# Patient Record
Sex: Male | Born: 1955 | Race: Black or African American | Hispanic: No | Marital: Married | Smoking: Current every day smoker
Health system: Southern US, Community
[De-identification: ages and names within clinical notes are randomized; demographics above are authoritative.]

## PROBLEM LIST (undated history)

## (undated) DIAGNOSIS — C61 Malignant neoplasm of prostate: Secondary | ICD-10-CM

## (undated) DIAGNOSIS — F1010 Alcohol abuse, uncomplicated: Secondary | ICD-10-CM

## (undated) DIAGNOSIS — I639 Cerebral infarction, unspecified: Secondary | ICD-10-CM

## (undated) DIAGNOSIS — R972 Elevated prostate specific antigen [PSA]: Secondary | ICD-10-CM

## (undated) DIAGNOSIS — E785 Hyperlipidemia, unspecified: Secondary | ICD-10-CM

## (undated) DIAGNOSIS — I10 Essential (primary) hypertension: Secondary | ICD-10-CM

## (undated) DIAGNOSIS — D649 Anemia, unspecified: Secondary | ICD-10-CM

## (undated) HISTORY — PX: PROSTATE BIOPSY: SHX241

---

## 2000-03-20 DIAGNOSIS — Z5900 Homelessness unspecified: Secondary | ICD-10-CM | POA: Insufficient documentation

## 2001-02-04 ENCOUNTER — Emergency Department (HOSPITAL_COMMUNITY): Admission: EM | Admit: 2001-02-04 | Discharge: 2001-02-04 | Payer: Self-pay | Admitting: Emergency Medicine

## 2003-11-11 ENCOUNTER — Emergency Department (HOSPITAL_COMMUNITY): Admission: AD | Admit: 2003-11-11 | Discharge: 2003-11-11 | Payer: Self-pay | Admitting: Emergency Medicine

## 2005-05-24 ENCOUNTER — Emergency Department (HOSPITAL_COMMUNITY): Admission: EM | Admit: 2005-05-24 | Discharge: 2005-05-24 | Payer: Self-pay | Admitting: Emergency Medicine

## 2016-12-27 ENCOUNTER — Inpatient Hospital Stay (HOSPITAL_COMMUNITY)
Admission: EM | Admit: 2016-12-27 | Discharge: 2017-01-02 | DRG: 065 | Disposition: A | Discharge status: Home / Self Care | Payer: Self-pay | Attending: Internal Medicine | Admitting: Internal Medicine

## 2016-12-27 ENCOUNTER — Emergency Department (HOSPITAL_COMMUNITY): Payer: Self-pay

## 2016-12-27 ENCOUNTER — Encounter (HOSPITAL_COMMUNITY): Payer: Self-pay | Admitting: *Deleted

## 2016-12-27 DIAGNOSIS — I639 Cerebral infarction, unspecified: Secondary | ICD-10-CM

## 2016-12-27 DIAGNOSIS — I10 Essential (primary) hypertension: Secondary | ICD-10-CM | POA: Diagnosis present

## 2016-12-27 DIAGNOSIS — I63512 Cerebral infarction due to unspecified occlusion or stenosis of left middle cerebral artery: Secondary | ICD-10-CM

## 2016-12-27 DIAGNOSIS — R441 Visual hallucinations: Secondary | ICD-10-CM | POA: Diagnosis present

## 2016-12-27 DIAGNOSIS — I6339 Cerebral infarction due to thrombosis of other cerebral artery: Secondary | ICD-10-CM

## 2016-12-27 DIAGNOSIS — F10930 Alcohol use, unspecified with withdrawal, uncomplicated: Secondary | ICD-10-CM

## 2016-12-27 DIAGNOSIS — K7010 Alcoholic hepatitis without ascites: Secondary | ICD-10-CM | POA: Diagnosis present

## 2016-12-27 DIAGNOSIS — L0390 Cellulitis, unspecified: Secondary | ICD-10-CM | POA: Diagnosis present

## 2016-12-27 DIAGNOSIS — E871 Hypo-osmolality and hyponatremia: Secondary | ICD-10-CM | POA: Diagnosis present

## 2016-12-27 DIAGNOSIS — F10230 Alcohol dependence with withdrawal, uncomplicated: Secondary | ICD-10-CM

## 2016-12-27 DIAGNOSIS — Z23 Encounter for immunization: Secondary | ICD-10-CM

## 2016-12-27 DIAGNOSIS — E876 Hypokalemia: Secondary | ICD-10-CM | POA: Diagnosis present

## 2016-12-27 DIAGNOSIS — F17200 Nicotine dependence, unspecified, uncomplicated: Secondary | ICD-10-CM

## 2016-12-27 DIAGNOSIS — R410 Disorientation, unspecified: Secondary | ICD-10-CM

## 2016-12-27 DIAGNOSIS — E785 Hyperlipidemia, unspecified: Secondary | ICD-10-CM | POA: Diagnosis present

## 2016-12-27 DIAGNOSIS — I493 Ventricular premature depolarization: Secondary | ICD-10-CM | POA: Diagnosis present

## 2016-12-27 DIAGNOSIS — F1010 Alcohol abuse, uncomplicated: Secondary | ICD-10-CM

## 2016-12-27 DIAGNOSIS — Z681 Body mass index (BMI) 19 or less, adult: Secondary | ICD-10-CM

## 2016-12-27 DIAGNOSIS — F10931 Alcohol use, unspecified with withdrawal delirium: Secondary | ICD-10-CM | POA: Diagnosis present

## 2016-12-27 DIAGNOSIS — R740 Nonspecific elevation of levels of transaminase and lactic acid dehydrogenase [LDH]: Secondary | ICD-10-CM | POA: Diagnosis present

## 2016-12-27 DIAGNOSIS — I63441 Cerebral infarction due to embolism of right cerebellar artery: Principal | ICD-10-CM | POA: Diagnosis present

## 2016-12-27 DIAGNOSIS — F17210 Nicotine dependence, cigarettes, uncomplicated: Secondary | ICD-10-CM | POA: Diagnosis present

## 2016-12-27 DIAGNOSIS — T184XXA Foreign body in colon, initial encounter: Secondary | ICD-10-CM

## 2016-12-27 DIAGNOSIS — R509 Fever, unspecified: Secondary | ICD-10-CM

## 2016-12-27 DIAGNOSIS — R471 Dysarthria and anarthria: Secondary | ICD-10-CM | POA: Diagnosis present

## 2016-12-27 DIAGNOSIS — D696 Thrombocytopenia, unspecified: Secondary | ICD-10-CM | POA: Diagnosis present

## 2016-12-27 DIAGNOSIS — T183XXA Foreign body in small intestine, initial encounter: Secondary | ICD-10-CM

## 2016-12-27 DIAGNOSIS — E46 Unspecified protein-calorie malnutrition: Secondary | ICD-10-CM | POA: Diagnosis present

## 2016-12-27 DIAGNOSIS — L03114 Cellulitis of left upper limb: Secondary | ICD-10-CM | POA: Diagnosis present

## 2016-12-27 DIAGNOSIS — F10231 Alcohol dependence with withdrawal delirium: Secondary | ICD-10-CM | POA: Diagnosis present

## 2016-12-27 DIAGNOSIS — E860 Dehydration: Secondary | ICD-10-CM | POA: Diagnosis present

## 2016-12-27 DIAGNOSIS — R569 Unspecified convulsions: Secondary | ICD-10-CM | POA: Diagnosis present

## 2016-12-27 HISTORY — DX: Alcohol abuse, uncomplicated: F10.10

## 2016-12-27 LAB — COMPREHENSIVE METABOLIC PANEL
ALBUMIN: 3.6 g/dL (ref 3.5–5.0)
ALT: 45 U/L (ref 17–63)
AST: 198 U/L — AB (ref 15–41)
Alkaline Phosphatase: 197 U/L — ABNORMAL HIGH (ref 38–126)
Anion gap: 17 — ABNORMAL HIGH (ref 5–15)
BILIRUBIN TOTAL: 1.8 mg/dL — AB (ref 0.3–1.2)
BUN: 13 mg/dL (ref 6–20)
CO2: 18 mmol/L — AB (ref 22–32)
Calcium: 9 mg/dL (ref 8.9–10.3)
Chloride: 97 mmol/L — ABNORMAL LOW (ref 101–111)
Creatinine, Ser: 1.83 mg/dL — ABNORMAL HIGH (ref 0.61–1.24)
GFR calc Af Amer: 45 mL/min — ABNORMAL LOW (ref >60)
GFR calc non Af Amer: 39 mL/min — ABNORMAL LOW (ref >60)
GLUCOSE: 82 mg/dL (ref 65–99)
POTASSIUM: 2.9 mmol/L — AB (ref 3.5–5.1)
Sodium: 132 mmol/L — ABNORMAL LOW (ref 135–145)
TOTAL PROTEIN: 8.2 g/dL — AB (ref 6.5–8.1)

## 2016-12-27 LAB — PROTIME-INR
INR: 1.1
Prothrombin Time: 14.2 seconds (ref 11.4–15.2)

## 2016-12-27 LAB — URINALYSIS, ROUTINE W REFLEX MICROSCOPIC
GLUCOSE, UA: NEGATIVE mg/dL
Ketones, ur: NEGATIVE mg/dL
LEUKOCYTES UA: NEGATIVE
NITRITE: NEGATIVE
PROTEIN: 100 mg/dL — AB
Specific Gravity, Urine: 1.027 (ref 1.005–1.030)
pH: 5 (ref 5.0–8.0)

## 2016-12-27 LAB — I-STAT CG4 LACTIC ACID, ED
Lactic Acid, Venous: 3.68 mmol/L (ref 0.5–1.9)
Lactic Acid, Venous: 1.05 mmol/L (ref 0.5–1.9)

## 2016-12-27 LAB — CBC WITH DIFFERENTIAL/PLATELET
Basophils Absolute: 0 10*3/uL (ref 0.0–0.1)
Basophils Relative: 0 %
EOS ABS: 0.1 10*3/uL (ref 0.0–0.7)
Eosinophils Relative: 1 %
HEMATOCRIT: 40.5 % (ref 39.0–52.0)
HEMOGLOBIN: 13.5 g/dL (ref 13.0–17.0)
LYMPHS ABS: 1.1 10*3/uL (ref 0.7–4.0)
LYMPHS PCT: 13 %
MCH: 33.8 pg (ref 26.0–34.0)
MCHC: 33.3 g/dL (ref 30.0–36.0)
MCV: 101.5 fL — AB (ref 78.0–100.0)
MONOS PCT: 10 %
Monocytes Absolute: 0.9 10*3/uL (ref 0.1–1.0)
NEUTROS ABS: 6.7 10*3/uL (ref 1.7–7.7)
NEUTROS PCT: 76 %
Platelets: 84 10*3/uL — ABNORMAL LOW (ref 150–400)
RBC: 3.99 MIL/uL — ABNORMAL LOW (ref 4.22–5.81)
RDW: 15.7 % — ABNORMAL HIGH (ref 11.5–15.5)
WBC: 8.8 10*3/uL (ref 4.0–10.5)

## 2016-12-27 LAB — ETHANOL

## 2016-12-27 MED ORDER — LORAZEPAM 2 MG/ML IJ SOLN: 0.0000 mg | Freq: Two times a day (BID) | INTRAMUSCULAR | Status: DC

## 2016-12-27 MED ORDER — SODIUM CHLORIDE 0.9 % IV BOLUS (SEPSIS)
1000.0000 mL | Freq: Once | INTRAVENOUS | Status: AC
  Administered 2016-12-27: 1000 mL via INTRAVENOUS

## 2016-12-27 MED ORDER — FOLIC ACID 1 MG PO TABS
1.0000 mg | ORAL_TABLET | Freq: Once | ORAL | Status: AC
  Administered 2016-12-27: 1 mg via ORAL
  Filled 2016-12-27: qty 1

## 2016-12-27 MED ORDER — ERYTHROMYCIN 5 MG/GM OP OINT
1.0000 "application " | TOPICAL_OINTMENT | Freq: Once | OPHTHALMIC | Status: AC
  Administered 2016-12-27: 1 via OPHTHALMIC
  Filled 2016-12-27: qty 3.5

## 2016-12-27 MED ORDER — VITAMIN B-1 100 MG PO TABS
100.0000 mg | ORAL_TABLET | Freq: Once | ORAL | Status: AC
  Administered 2016-12-27: 100 mg via ORAL
  Filled 2016-12-27: qty 1

## 2016-12-27 MED ORDER — LORAZEPAM 2 MG/ML IJ SOLN
2.0000 mg | Freq: Once | INTRAMUSCULAR | Status: AC
  Administered 2016-12-27: 2 mg via INTRAVENOUS
  Filled 2016-12-27: qty 1

## 2016-12-27 MED ORDER — LORAZEPAM 2 MG/ML IJ SOLN
1.0000 mg | Freq: Once | INTRAMUSCULAR | Status: AC
  Administered 2016-12-27: 1 mg via INTRAVENOUS
  Filled 2016-12-27: qty 1

## 2016-12-27 MED ORDER — LORAZEPAM 2 MG/ML IJ SOLN: 0.0000 mg | Freq: Four times a day (QID) | INTRAMUSCULAR | Status: DC

## 2016-12-27 NOTE — ED Triage Notes (Signed)
Pt here with his step-sister for weakness.  His sister [place] could not contact him by phone, so a family member here when to check on him.  She states he has lost a lot of weight and she was barely able to get him out of the house, he was so weak.  Pt repeatedly unable to tell me what year it is and family states multiple episodes of syncope.  Pt states he's "had the flu" for  Several weeks.  States only drinks 24 oz etoh and then more on the weekend.

## 2016-12-27 NOTE — Consult Note (Signed)
Admission H&P    Chief Complaint: Altered mental status with confusion and agitation.  HPI: Ian Thornton is an 61 y.o. male with a history of alcohol abuse brought to the ED by family members because of a change in mental status with agitation and confusion. They also noticed visual hallucinations. It's unclear when patient last drank alcohol. He reportedly had a seizure 2-3 days ago. No medical attention was sought. Alcohol level in ED was undetectable. CT scan of his head showed a low density right basal ganglial lesion indicative of acute possible subacute ischemic stroke. It's unclear when he was last known normal. No focal weakness, facial droop is been noted by family.  LSN: Unclear tPA Given: No: Unknown when last seen well mRankin:  History reviewed. No pertinent past medical history.  History reviewed. No pertinent surgical history.  No family history on file. Social History:  reports that he has been smoking.  He has been smoking about 0.50 packs per day. He has never used smokeless tobacco. He reports that he drinks about 9.6 oz of alcohol per week . He reports that he does not use drugs.  Allergies: No Known Allergies  Medications: Unknown if patient takes any medications currently.  ROS: Unavailable due to patient's status changes.  Physical Examination: Blood pressure 172/76, pulse 102, temperature 97.7 F (36.5 C), temperature source Oral, resp. rate 20, weight 63.3 kg (139 lb 9 oz), SpO2 (!) 85 %.  HEENT-  Normocephalic, no lesions, without obvious abnormality.  Normal external eye and conjunctiva.  Normal TM's bilaterally.  Normal auditory canals and external ears. Normal external nose, mucus membranes and septum.  Normal pharynx. Neck supple with no masses, nodes, nodules or enlargement. Cardiovascular - mild tachycardia, normal rhythm, normal S1 and S2, no murmur or gallop Lungs - chest clear, no wheezing, rales, normal symmetric air entry Abdomen - soft,  non-tender; bowel sounds normal; no masses,  no organomegaly Extremities - no joint deformities, effusion, or inflammation  Neurologic Examination: Mental Status: Alert, oriented, moderately agitated and freely confabulating.  Speech fluent without evidence of aphasia. Able to follow commands without difficulty. Cranial Nerves: II-Visual fields were normal. III/IV/VI-Pupils were equal and reacted normally to light. Extraocular movements were full and conjugate.    V/VII-no facial numbness and no facial weakness. VIII-normal. X-mild dysarthria. XI: trapezius strength/neck flexion strength normal bilaterally XII-midline tongue extension with normal strength. Motor: 5/5 bilaterally with normal tone and bulk Sensory: Normal throughout. Deep Tendon Reflexes: 1+ and symmetric. Plantars: Flexor bilaterally Cerebellar: Normal finger-to-nose testing septal mild intention tremor bilaterally.   Results for orders placed or performed during the hospital encounter of 12/27/16 (from the past 48 hour(s))  Comprehensive metabolic panel     Status: Abnormal   Collection Time: 12/27/16  7:00 PM  Result Value Ref Range   Sodium 132 (L) 135 - 145 mmol/L   Potassium 2.9 (L) 3.5 - 5.1 mmol/L   Chloride 97 (L) 101 - 111 mmol/L   CO2 18 (L) 22 - 32 mmol/L   Glucose, Bld 82 65 - 99 mg/dL   BUN 13 6 - 20 mg/dL   Creatinine, Ser 1.83 (H) 0.61 - 1.24 mg/dL   Calcium 9.0 8.9 - 10.3 mg/dL   Total Protein 8.2 (H) 6.5 - 8.1 g/dL   Albumin 3.6 3.5 - 5.0 g/dL   AST 198 (H) 15 - 41 U/L   ALT 45 17 - 63 U/L   Alkaline Phosphatase 197 (H) 38 - 126 U/L   Total Bilirubin  1.8 (H) 0.3 - 1.2 mg/dL   GFR calc non Af Amer 39 (L) >60 mL/min   GFR calc Af Amer 45 (L) >60 mL/min    Comment: (NOTE) The eGFR has been calculated using the CKD EPI equation. This calculation has not been validated in all clinical situations. eGFR's persistently <60 mL/min signify possible Chronic Kidney Disease.    Anion gap 17 (H) 5 -  15  CBC with Differential     Status: Abnormal   Collection Time: 12/27/16  7:00 PM  Result Value Ref Range   WBC 8.8 4.0 - 10.5 K/uL   RBC 3.99 (L) 4.22 - 5.81 MIL/uL   Hemoglobin 13.5 13.0 - 17.0 g/dL   HCT 40.5 39.0 - 52.0 %   MCV 101.5 (H) 78.0 - 100.0 fL   MCH 33.8 26.0 - 34.0 pg   MCHC 33.3 30.0 - 36.0 g/dL   RDW 15.7 (H) 11.5 - 15.5 %   Platelets 84 (L) 150 - 400 K/uL    Comment: SPECIMEN CHECKED FOR CLOTS REPEATED TO VERIFY PLATELET COUNT CONFIRMED BY SMEAR LARGE PLATELETS PRESENT    Neutrophils Relative % 76 %   Neutro Abs 6.7 1.7 - 7.7 K/uL   Lymphocytes Relative 13 %   Lymphs Abs 1.1 0.7 - 4.0 K/uL   Monocytes Relative 10 %   Monocytes Absolute 0.9 0.1 - 1.0 K/uL   Eosinophils Relative 1 %   Eosinophils Absolute 0.1 0.0 - 0.7 K/uL   Basophils Relative 0 %   Basophils Absolute 0.0 0.0 - 0.1 K/uL  Protime-INR     Status: None   Collection Time: 12/27/16  7:00 PM  Result Value Ref Range   Prothrombin Time 14.2 11.4 - 15.2 seconds   INR 1.10   I-Stat CG4 Lactic Acid, ED     Status: Abnormal   Collection Time: 12/27/16  8:09 PM  Result Value Ref Range   Lactic Acid, Venous 3.68 (HH) 0.5 - 1.9 mmol/L   Comment NOTIFIED PHYSICIAN   Ethanol     Status: None   Collection Time: 12/27/16  8:36 PM  Result Value Ref Range   Alcohol, Ethyl (B) <5 <5 mg/dL    Comment:        LOWEST DETECTABLE LIMIT FOR SERUM ALCOHOL IS 5 mg/dL FOR MEDICAL PURPOSES ONLY   Urinalysis, Routine w reflex microscopic     Status: Abnormal   Collection Time: 12/27/16  8:40 PM  Result Value Ref Range   Color, Urine AMBER (A) YELLOW    Comment: BIOCHEMICALS MAY BE AFFECTED BY COLOR   APPearance CLOUDY (A) CLEAR   Specific Gravity, Urine 1.027 1.005 - 1.030   pH 5.0 5.0 - 8.0   Glucose, UA NEGATIVE NEGATIVE mg/dL   Hgb urine dipstick SMALL (A) NEGATIVE   Bilirubin Urine MODERATE (A) NEGATIVE   Ketones, ur NEGATIVE NEGATIVE mg/dL   Protein, ur 100 (A) NEGATIVE mg/dL   Nitrite NEGATIVE  NEGATIVE   Leukocytes, UA NEGATIVE NEGATIVE   RBC / HPF 6-30 0 - 5 RBC/hpf   WBC, UA 6-30 0 - 5 WBC/hpf   Bacteria, UA RARE (A) NONE SEEN   Squamous Epithelial / LPF 0-5 (A) NONE SEEN   Mucous PRESENT    Hyaline Casts, UA PRESENT   I-Stat CG4 Lactic Acid, ED     Status: None   Collection Time: 12/27/16  9:26 PM  Result Value Ref Range   Lactic Acid, Venous 1.05 0.5 - 1.9 mmol/L   Dg Chest 2  View  Result Date: 12/27/2016 CLINICAL DATA:  Weakness and weight loss. EXAM: CHEST  2 VIEW COMPARISON:  Report from 08/09/2000 FINDINGS: The heart size and mediastinal contours are within normal limits. The lungs are mildly hyperinflated without pneumonic consolidation, CHF nor effusion. No pneumothorax. Bilateral nipple shadows are seen over the lower lobes. The visualized skeletal structures are unremarkable. IMPRESSION: No active cardiopulmonary disease. Mild hyperinflation of the lungs. Electronically Signed   By: Ashley Royalty M.D.   On: 12/27/2016 19:27   Ct Head Wo Contrast  Result Date: 12/27/2016 CLINICAL DATA:  Initial evaluation for acute weakness. EXAM: CT HEAD WITHOUT CONTRAST TECHNIQUE: Contiguous axial images were obtained from the base of the skull through the vertex without intravenous contrast. COMPARISON:  Prior CT from 05/25/2005. FINDINGS: Brain: Diffuse prominence of the CSF containing spaces is compatible with generalized cerebral atrophy. Patchy hypodensity within the periventricular and deep white matter of both cerebral hemispheres is most consistent with chronic microvascular ischemic disease. Small remote lacunar infarct within the left basal ganglia. There is evolving hypodensity involving the right basal ganglia, with involvement of the right caudate and anterior lenticular nucleus, compatible with acute ischemic infarct. No associated mass effect or hemorrhage. No other acute intracranial hemorrhage or infarct identified. No mass lesion, midline shift, or mass effect. No  hydrocephalus. No extra-axial fluid collection. Vascular: No hyperdense vessel. Scattered vascular calcifications noted within the carotid siphons. Skull: Focal hyperdensity noted at the right frontal scalp, of doubtful clinical significance. Scalp soft tissues demonstrate no acute abnormality. Calvarium intact. Sinuses/Orbits: Globes and orbital soft tissues within normal limits. Paranasal sinuses and mastoid air cells are clear. IMPRESSION: 1. Evolving acute/early subacute ischemic right basal ganglia infarct. 2. Small remote lacunar infarct within the left basal ganglia. 3. Age-related cerebral atrophy with chronic microvascular ischemic disease. Electronically Signed   By: Jeannine Boga M.D.   On: 12/27/2016 21:51    Assessment: 61 y.o. male with unclear past medical history presenting with acute delirium, most likely associated with alcohol withdrawal, hypertension, and probable acute to subacute right basal ganglia ischemic infarction.  Stroke Risk Factors - hypertension  Plan: 1. HgbA1c, fasting lipid panel 2. MRI, MRA  of the brain without contrast 3. PT consult, OT consult, Speech consult 4. Echocardiogram 5. Carotid dopplers 6. Prophylactic therapy-Antiplatelet med: Aspirin  7. Risk factor modification 8. Telemetry monitoring 9. EEG, routine adult study 10. No anticonvulsant medication indicated unless patient has a recurrent witnessed seizure, or EEG indicates possible underlying seizure disorder 11. CIWA protocol for management of alcohol withdrawal and acute delirium  C.R. Nicole Kindred, MD [facility] [phone]  12/27/2016, 11:04 PM

## 2016-12-27 NOTE — ED Provider Notes (Signed)
[facility]-EMERGENCY DEPT Provider Note   CSN: [id number] Arrival date & time: 12/27/16  1802     History   Chief Complaint Chief Complaint  Patient presents with  . Weakness    HPI Brysen Shankman is a 61 y.o. male.  HPI  Patient presents with his stepsister who assists with the history of present illness. Patient is hallucinating, speaking tangentially, nonsensically, level V caveat secondary to change in mental status per She notes that over the past day she has noticed increasingly atypical behavior from the patient, with ongoing cough, anorexia.  Low 5 caveat    History reviewed. No pertinent past medical history.  There are no active problems to display for this patient.   History reviewed. No pertinent surgical history.     Home Medications    Prior to Admission medications   Not on File    Family History No family history on file.  Social History Social History  Substance Use Topics  . Smoking status: Current Every Day Smoker    Packs/day: 0.50  . Smokeless tobacco: Never Used  . Alcohol use 9.6 oz/week    16 Shots of liquor per week     Comment: 24 oz beer per day     Allergies   Patient has no known allergies.   Review of Systems Review of Systems  Unable to perform ROS: Mental status change     Physical Exam Updated Vital Signs BP (!) 122/103   Pulse 98   Temp 97.7 F (36.5 C) (Oral)   Resp 20   Wt 139 lb 9 oz (63.3 kg)   SpO2 100%   Physical Exam  Constitutional: He has a sickly appearance. No distress.  HENT:  Head: Normocephalic and atraumatic.  Eyes: Conjunctivae and EOM are normal.    Cardiovascular: Normal rate and regular rhythm.   Pulmonary/Chest: Effort normal. No stridor. No respiratory distress.  Abdominal: He exhibits no distension.  Musculoskeletal: He exhibits no edema.  Neurological: He is alert. He displays no atrophy and no tremor. No sensory deficit. He exhibits normal muscle tone.  Skin: Skin is warm and  dry.  Psychiatric: His mood appears anxious. His speech is tangential. Thought content is delusional. Cognition and memory are impaired.  Nursing note and vitals reviewed.    ED Treatments / Results  Labs (all labs ordered are listed, but only abnormal results are displayed) Labs Reviewed  COMPREHENSIVE METABOLIC PANEL - Abnormal; Notable for the following:       Result Value   Sodium 132 (*)    Potassium 2.9 (*)    Chloride 97 (*)    CO2 18 (*)    Creatinine, Ser 1.83 (*)    Total Protein 8.2 (*)    AST 198 (*)    Alkaline Phosphatase 197 (*)    Total Bilirubin 1.8 (*)    GFR calc non Af Amer 39 (*)    GFR calc Af Amer 45 (*)    Anion gap 17 (*)    All other components within normal limits  CBC WITH DIFFERENTIAL/PLATELET - Abnormal; Notable for the following:    RBC 3.99 (*)    MCV 101.5 (*)    RDW 15.7 (*)    Platelets 84 (*)    All other components within normal limits  URINALYSIS, ROUTINE W REFLEX MICROSCOPIC - Abnormal; Notable for the following:    Color, Urine AMBER (*)    APPearance CLOUDY (*)    Hgb urine dipstick SMALL (*)  Bilirubin Urine MODERATE (*)    Protein, ur 100 (*)    Bacteria, UA RARE (*)    Squamous Epithelial / LPF 0-5 (*)    All other components within normal limits  I-STAT CG4 LACTIC ACID, ED - Abnormal; Notable for the following:    Lactic Acid, Venous 3.68 (*)    All other components within normal limits  CULTURE, BLOOD (ROUTINE X 2)  CULTURE, BLOOD (ROUTINE X 2)  URINE CULTURE  PROTIME-INR  ETHANOL  I-STAT CG4 LACTIC ACID, ED    EKG  EKG Interpretation None       Radiology Dg Chest 2 View  Result Date: 12/27/2016 CLINICAL DATA:  Weakness and weight loss. EXAM: CHEST  2 VIEW COMPARISON:  Report from 08/09/2000 FINDINGS: The heart size and mediastinal contours are within normal limits. The lungs are mildly hyperinflated without pneumonic consolidation, CHF nor effusion. No pneumothorax. Bilateral nipple shadows are seen over the  lower lobes. The visualized skeletal structures are unremarkable. IMPRESSION: No active cardiopulmonary disease. Mild hyperinflation of the lungs. Electronically Signed   By: Tollie Eth M.D.   On: 12/27/2016 19:27   Ct Head Wo Contrast  Result Date: 12/27/2016 CLINICAL DATA:  Initial evaluation for acute weakness. EXAM: CT HEAD WITHOUT CONTRAST TECHNIQUE: Contiguous axial images were obtained from the base of the skull through the vertex without intravenous contrast. COMPARISON:  Prior CT from 05/25/2005. FINDINGS: Brain: Diffuse prominence of the CSF containing spaces is compatible with generalized cerebral atrophy. Patchy hypodensity within the periventricular and deep white matter of both cerebral hemispheres is most consistent with chronic microvascular ischemic disease. Small remote lacunar infarct within the left basal ganglia. There is evolving hypodensity involving the right basal ganglia, with involvement of the right caudate and anterior lenticular nucleus, compatible with acute ischemic infarct. No associated mass effect or hemorrhage. No other acute intracranial hemorrhage or infarct identified. No mass lesion, midline shift, or mass effect. No hydrocephalus. No extra-axial fluid collection. Vascular: No hyperdense vessel. Scattered vascular calcifications noted within the carotid siphons. Skull: Focal hyperdensity noted at the right frontal scalp, of doubtful clinical significance. Scalp soft tissues demonstrate no acute abnormality. Calvarium intact. Sinuses/Orbits: Globes and orbital soft tissues within normal limits. Paranasal sinuses and mastoid air cells are clear. IMPRESSION: 1. Evolving acute/early subacute ischemic right basal ganglia infarct. 2. Small remote lacunar infarct within the left basal ganglia. 3. Age-related cerebral atrophy with chronic microvascular ischemic disease. Electronically Signed   By: Rise Mu M.D.   On: 12/27/2016 21:51    Procedures Procedures  (including critical care time)  Medications Ordered in ED Medications  LORazepam (ATIVAN) injection 0-4 mg (not administered)    Followed by  LORazepam (ATIVAN) injection 0-4 mg (not administered)  thiamine (VITAMIN B-1) tablet 100 mg (100 mg Oral Given 12/27/16 2205)  folic acid (FOLVITE) tablet 1 mg (1 mg Oral Given 12/27/16 2205)  sodium chloride 0.9 % bolus 1,000 mL (0 mLs Intravenous Stopped 12/27/16 2307)  erythromycin ophthalmic ointment 1 application (1 application Both Eyes Given 12/27/16 2204)  LORazepam (ATIVAN) injection 1 mg (1 mg Intravenous Given 12/27/16 2303)  LORazepam (ATIVAN) injection 2 mg (2 mg Intravenous Given 12/27/16 2359)     Initial Impression / Assessment and Plan / ED Course  I have reviewed the triage vital signs and the nursing notes.  Pertinent labs & imaging results that were available during my care of the patient were reviewed by me and considered in my medical decision making (see chart for  details).  On repeat exam the patient remains in similar condition. He continues to speak nonsensically.  Family acknowledges patient has Hx of etoh use, but it is unclear if this is ongoing.  With abnormal initial CT findings, patient's altered mental status and discussed this case with our neurologist. With consideration of withdrawal versus stroke, patient with MRI, be admitted for further evaluation and management.  CIWA initiated.  Final Clinical Impressions(s) / ED Diagnoses   Final diagnoses:  Dysarthria  Delirium     Gerhard Munchobert Inika Bellanger, MD 12/28/16 0005

## 2016-12-27 NOTE — ED Notes (Signed)
Pt's family member approached this EMT asking if the pt could get a room. Pt was told that he was going back next. Family member stated she was concerned because "he's acting delusional".

## 2016-12-27 NOTE — ED Notes (Signed)
Patient transported to MRI 

## 2016-12-28 ENCOUNTER — Encounter (HOSPITAL_COMMUNITY): Payer: Self-pay | Admitting: Internal Medicine

## 2016-12-28 ENCOUNTER — Inpatient Hospital Stay (HOSPITAL_COMMUNITY): Payer: Self-pay

## 2016-12-28 DIAGNOSIS — I639 Cerebral infarction, unspecified: Secondary | ICD-10-CM

## 2016-12-28 DIAGNOSIS — F10230 Alcohol dependence with withdrawal, uncomplicated: Secondary | ICD-10-CM

## 2016-12-28 DIAGNOSIS — F10931 Alcohol use, unspecified with withdrawal delirium: Secondary | ICD-10-CM | POA: Diagnosis present

## 2016-12-28 DIAGNOSIS — R569 Unspecified convulsions: Secondary | ICD-10-CM

## 2016-12-28 DIAGNOSIS — I6521 Occlusion and stenosis of right carotid artery: Secondary | ICD-10-CM

## 2016-12-28 DIAGNOSIS — I63512 Cerebral infarction due to unspecified occlusion or stenosis of left middle cerebral artery: Secondary | ICD-10-CM

## 2016-12-28 DIAGNOSIS — E785 Hyperlipidemia, unspecified: Secondary | ICD-10-CM

## 2016-12-28 DIAGNOSIS — E876 Hypokalemia: Secondary | ICD-10-CM

## 2016-12-28 DIAGNOSIS — R471 Dysarthria and anarthria: Secondary | ICD-10-CM

## 2016-12-28 DIAGNOSIS — I63311 Cerebral infarction due to thrombosis of right middle cerebral artery: Secondary | ICD-10-CM

## 2016-12-28 DIAGNOSIS — D696 Thrombocytopenia, unspecified: Secondary | ICD-10-CM | POA: Diagnosis present

## 2016-12-28 DIAGNOSIS — F17200 Nicotine dependence, unspecified, uncomplicated: Secondary | ICD-10-CM

## 2016-12-28 DIAGNOSIS — F10231 Alcohol dependence with withdrawal delirium: Secondary | ICD-10-CM

## 2016-12-28 DIAGNOSIS — F1010 Alcohol abuse, uncomplicated: Secondary | ICD-10-CM | POA: Diagnosis present

## 2016-12-28 LAB — BASIC METABOLIC PANEL
ANION GAP: 17 — AB (ref 5–15)
ANION GAP: 12 (ref 5–15)
BUN: 15 mg/dL (ref 6–20)
BUN: 10 mg/dL (ref 6–20)
CALCIUM: 8.2 mg/dL — AB (ref 8.9–10.3)
CHLORIDE: 100 mmol/L — AB (ref 101–111)
CO2: 17 mmol/L — ABNORMAL LOW (ref 22–32)
CO2: 18 mmol/L — ABNORMAL LOW (ref 22–32)
Calcium: 8 mg/dL — ABNORMAL LOW (ref 8.9–10.3)
Chloride: 104 mmol/L (ref 101–111)
Creatinine, Ser: 1.32 mg/dL — ABNORMAL HIGH (ref 0.61–1.24)
Creatinine, Ser: 0.85 mg/dL (ref 0.61–1.24)
GFR calc Af Amer: >60 mL/min (ref >60)
GFR calc non Af Amer: 58 mL/min — ABNORMAL LOW (ref >60)
Glucose, Bld: 75 mg/dL (ref 65–99)
Glucose, Bld: 70 mg/dL (ref 65–99)
POTASSIUM: 2.8 mmol/L — AB (ref 3.5–5.1)
POTASSIUM: 3 mmol/L — AB (ref 3.5–5.1)
SODIUM: 134 mmol/L — AB (ref 135–145)
SODIUM: 134 mmol/L — AB (ref 135–145)

## 2016-12-28 LAB — MRSA PCR SCREENING: MRSA by PCR: NEGATIVE

## 2016-12-28 LAB — RAPID URINE DRUG SCREEN, HOSP PERFORMED
Amphetamines: NOT DETECTED
BARBITURATES: NOT DETECTED
BENZODIAZEPINES: POSITIVE — AB
Cocaine: NOT DETECTED
Opiates: NOT DETECTED
TETRAHYDROCANNABINOL: NOT DETECTED

## 2016-12-28 LAB — ECHOCARDIOGRAM COMPLETE
HEIGHTINCHES: 70 in
WEIGHTICAEL: 2211.2 [oz_av]

## 2016-12-28 LAB — MAGNESIUM: Magnesium: 1.6 mg/dL — ABNORMAL LOW (ref 1.7–2.4)

## 2016-12-28 LAB — LIPID PANEL
CHOL/HDL RATIO: 5.5 ratio
Cholesterol: 153 mg/dL (ref 0–200)
HDL: 28 mg/dL — AB (ref >40)
LDL CALC: 87 mg/dL (ref 0–99)
Triglycerides: 190 mg/dL — ABNORMAL HIGH (ref <150)
VLDL: 38 mg/dL (ref 0–40)

## 2016-12-28 MED ORDER — IOPAMIDOL (ISOVUE-370) INJECTION 76%
INTRAVENOUS | Status: AC
  Administered 2016-12-28: 50 mL
  Filled 2016-12-28: qty 50

## 2016-12-28 MED ORDER — ASPIRIN 300 MG RE SUPP: 300.0000 mg | Freq: Every day | RECTAL | Status: DC

## 2016-12-28 MED ORDER — VITAMIN B-1 100 MG PO TABS
100.0000 mg | ORAL_TABLET | Freq: Every day | ORAL | Status: DC
  Administered 2016-12-28: 100 mg via ORAL
  Filled 2016-12-28: qty 1

## 2016-12-28 MED ORDER — ACETAMINOPHEN 160 MG/5ML PO SOLN: 650.0000 mg | ORAL | Status: DC | PRN

## 2016-12-28 MED ORDER — FOLIC ACID 1 MG PO TABS
1.0000 mg | ORAL_TABLET | Freq: Every day | ORAL | Status: DC
  Administered 2016-12-29 – 2017-01-02 (×5): 1 mg via ORAL
  Filled 2016-12-28 (×5): qty 1

## 2016-12-28 MED ORDER — THIAMINE HCL 100 MG/ML IJ SOLN: 100.0000 mg | Freq: Every day | INTRAMUSCULAR | Status: DC

## 2016-12-28 MED ORDER — ASPIRIN EC 325 MG PO TBEC
325.0000 mg | DELAYED_RELEASE_TABLET | Freq: Every day | ORAL | Status: DC
  Administered 2016-12-29 – 2017-01-02 (×5): 325 mg via ORAL
  Filled 2016-12-28 (×5): qty 1

## 2016-12-28 MED ORDER — ASPIRIN EC 325 MG PO TBEC: 325.0000 mg | DELAYED_RELEASE_TABLET | Freq: Every day | ORAL | Status: DC

## 2016-12-28 MED ORDER — ACETAMINOPHEN 650 MG RE SUPP: 650.0000 mg | RECTAL | Status: DC | PRN

## 2016-12-28 MED ORDER — STROKE: EARLY STAGES OF RECOVERY BOOK
Freq: Once | Status: DC
  Filled 2016-12-28: qty 1

## 2016-12-28 MED ORDER — LORAZEPAM 1 MG PO TABS: 1.0000 mg | ORAL_TABLET | Freq: Four times a day (QID) | ORAL | Status: DC | PRN

## 2016-12-28 MED ORDER — LORAZEPAM 2 MG/ML IJ SOLN
2.0000 mg | INTRAMUSCULAR | Status: DC | PRN
  Administered 2016-12-28 – 2016-12-31 (×4): 2 mg via INTRAVENOUS
  Filled 2016-12-28 (×5): qty 1

## 2016-12-28 MED ORDER — SODIUM CHLORIDE 0.9 % IV SOLN
INTRAVENOUS | Status: DC
  Administered 2016-12-28 – 2016-12-29 (×2): via INTRAVENOUS

## 2016-12-28 MED ORDER — ACETAMINOPHEN 325 MG PO TABS
650.0000 mg | ORAL_TABLET | ORAL | Status: DC | PRN
  Administered 2016-12-30 – 2016-12-31 (×3): 650 mg via ORAL
  Filled 2016-12-28 (×3): qty 2

## 2016-12-28 MED ORDER — IOPAMIDOL (ISOVUE-370) INJECTION 76%
INTRAVENOUS | Status: AC
  Filled 2016-12-28: qty 50

## 2016-12-28 MED ORDER — ADULT MULTIVITAMIN W/MINERALS CH
1.0000 | ORAL_TABLET | Freq: Every day | ORAL | Status: DC
  Administered 2016-12-29 – 2017-01-02 (×5): 1 via ORAL
  Filled 2016-12-28 (×5): qty 1

## 2016-12-28 MED ORDER — ATORVASTATIN CALCIUM 40 MG PO TABS
40.0000 mg | ORAL_TABLET | Freq: Every day | ORAL | Status: DC
  Administered 2016-12-28 – 2017-01-01 (×5): 40 mg via ORAL
  Filled 2016-12-28 (×5): qty 1

## 2016-12-28 MED ORDER — LORAZEPAM 2 MG/ML IJ SOLN: 1.0000 mg | Freq: Four times a day (QID) | INTRAMUSCULAR | Status: DC | PRN

## 2016-12-28 MED ORDER — SODIUM CHLORIDE 0.9 % IV SOLN
30.0000 meq | Freq: Once | INTRAVENOUS | Status: AC
  Administered 2016-12-28: 30 meq via INTRAVENOUS
  Filled 2016-12-28: qty 15

## 2016-12-28 MED ORDER — MAGNESIUM SULFATE 4 GM/100ML IV SOLN
4.0000 g | Freq: Once | INTRAVENOUS | Status: AC
  Administered 2016-12-28: 4 g via INTRAVENOUS
  Filled 2016-12-28: qty 100

## 2016-12-28 NOTE — Evaluation (Signed)
Physical Therapy Evaluation Patient Details Name: Ian Thornton MRN: [id number] DOB: Apr 03, 1956 Today's Date: 12/28/2016   History of Present Illness  Ian Thornton an 61 y.o.malewith a history of alcohol abuse brought to the ED by family members on 12/27/16 because of a change in mental status with agitation and confusion. They also noticed visual hallucinations. It's unclear when patient last drank alcohol. He reportedly had a seizure 2-3 days ago. No medical attention was sought. Alcohol level in ED was undetectable. CT scan of his head showed a low density right basal ganglial lesion indicative of acute possible subacute ischemic stroke. It's unclear when he was last known normal. No focal weakness, facial droop has been noted by family.     Clinical Impression  Patient presents with problems listed below.  Will benefit from acute PT to maximize functional mobility prior to discharge.  Patient requiring assist with mobility, and with decreased cognition.  Recommend SNF at d/c for continued therapy.  Will continue to reassess as patient continues to improve.    Follow Up Recommendations SNF;Supervision/Assistance - 24 hour    Equipment Recommendations  Other (comment) (TBD)    Recommendations for Other Services       Precautions / Restrictions Precautions Precautions: Fall Precaution Comments: agitation, confusion Restrictions Weight Bearing Restrictions: No Other Position/Activity Restrictions: Mitts BUE's      Mobility  Bed Mobility Overal bed mobility: Needs Assistance Bed Mobility: Supine to Sit;Sit to Supine     Supine to sit: Min guard;+2 for safety/equipment Sit to supine: Min guard;+2 for safety/equipment   General bed mobility comments: Repeated verbal cues to move to EOB.  No physical assist needed.  Assist for safety.  Transfers Overall transfer level: Needs assistance Equipment used: None Transfers: Sit to/from Stand Sit to Stand: Min assist;+2  safety/equipment         General transfer comment: Assist to steady during transfers.  Ambulation/Gait Ambulation/Gait assistance: Min assist;+2 safety/equipment Ambulation Distance (Feet): 22 Feet Assistive device: None Gait Pattern/deviations: Step-through pattern;Decreased step length - right;Decreased step length - left;Decreased stride length;Shuffle;Ataxic     General Gait Details: Patient with unsteady gait, requiring assist for balance and safety.  Stairs            Wheelchair Mobility    Modified Rankin (Stroke Patients Only) Modified Rankin (Stroke Patients Only) Pre-Morbid Rankin Score: No symptoms Modified Rankin: Moderately severe disability     Balance Overall balance assessment: Needs assistance         Standing balance support: No upper extremity supported Standing balance-Leahy Scale: Fair Standing balance comment: Assist for dynamic activities                             Pertinent Vitals/Pain Pain Assessment: No/denies pain    Home Living Family/patient expects to be discharged to:: Unsure                 Additional Comments: Patient unable to provide home living information accurately.    Prior Function Level of Independence: Independent         Comments: Patient reports he was independent pta - unsure of accuracy.     Hand Dominance        Extremity/Trunk Assessment   Upper Extremity Assessment Upper Extremity Assessment: Overall WFL for tasks assessed    Lower Extremity Assessment Lower Extremity Assessment: Generalized weakness;Difficult to assess due to impaired cognition       Communication   Communication:  No difficulties  Cognition Arousal/Alertness: Awake/alert Behavior During Therapy: Restless;Impulsive;Agitated Overall Cognitive Status: No family/caregiver present to determine baseline cognitive functioning                 General Comments: Reports at d/c he is "going back to  service" (in [employer]).    General Comments      Exercises     Assessment/Plan    PT Assessment Patient needs continued PT services  PT Problem List Decreased strength;Decreased activity tolerance;Decreased balance;Decreased mobility;Decreased cognition;Decreased knowledge of use of DME;Decreased safety awareness          PT Treatment Interventions DME instruction;Gait training;Functional mobility training;Therapeutic activities;Therapeutic exercise;Balance training;Cognitive remediation;Patient/family education    PT Goals (Current goals can be found in the Care Plan section)  Acute Rehab PT Goals Patient Stated Goal: None stated PT Goal Formulation: Patient unable to participate in goal setting Time For Goal Achievement: 01/04/17 Potential to Achieve Goals: Fair    Frequency Min 2X/week   Barriers to discharge        Co-evaluation               End of Session Equipment Utilized During Treatment: Gait belt Activity Tolerance: Patient tolerated treatment well;Treatment limited secondary to agitation Patient left: in bed;with call bell/phone within reach;with bed alarm set;with restraints reapplied (Mitts BUE's) Nurse Communication: Mobility status         Time: 4540-9811 PT Time Calculation (min) (ACUTE ONLY): 9 min   Charges:   PT Evaluation $PT Eval Moderate Complexity: 1 Procedure     PT G Codes:        Vena Austria Jan 01, 2017, 7:48 PM Durenda Hurt. Renaldo Fiddler, [facility] Acute Rehab Services Pager [phone]

## 2016-12-28 NOTE — Procedures (Signed)
Electroencephalogram (EEG) Report  Date of study: 12/28/16  Requesting clinician: Joycie PeekJindong Xiu, MD  Reason for study: Evaluate for seizure  Brief clinical history: This is a 61 year old man admitted due to change in mental status with agitation and confusion. Neurologic seizure 2-3 days ago in the setting of probable alcohol withdrawal. EEG is now being performed for further evaluation.  Medications:  Current Facility-Administered Medications:  .   stroke: mapping our early stages of recovery book, , Does not apply, Once, Jared M Gardner, DO .  0.9 %  sodium chloride infusion, , Intravenous, Continuous, Marvel PlanJindong Xu, MD, Last Rate: 75 mL/hr at 12/28/16 1317 .  acetaminophen (TYLENOL) tablet 650 mg, 650 mg, Oral, Q4H PRN **OR** acetaminophen (TYLENOL) solution 650 mg, 650 mg, Per Tube, Q4H PRN **OR** acetaminophen (TYLENOL) suppository 650 mg, 650 mg, Rectal, Q4H PRN, Hillary BowJared M Gardner, DO .  aspirin EC tablet 325 mg, 325 mg, Oral, Daily **OR** aspirin suppository 300 mg, 300 mg, Rectal, Daily, Marvel PlanJindong Xu, MD .  atorvastatin (LIPITOR) tablet 40 mg, 40 mg, Oral, q1800, Marvel PlanJindong Xu, MD .  folic acid (FOLVITE) tablet 1 mg, 1 mg, Oral, Daily, Hillary BowJared M Gardner, DO .  LORazepam (ATIVAN) injection 2-3 mg, 2-3 mg, Intravenous, Q1H PRN, Alexis Hugelmeyer, DO, 2 mg at 12/28/16 0531 .  multivitamin with minerals tablet 1 tablet, 1 tablet, Oral, Daily, Jared M Gardner, DO .  thiamine (VITAMIN B-1) tablet 100 mg, 100 mg, Oral, Daily **OR** thiamine (B-1) injection 100 mg, 100 mg, Intravenous, Daily, Hillary BowJared M Gardner, DO  Description: This is a routine EEG performed using standard international 10-20 electrode placement. A total of 18 channels are recorded, including one for the EKG. Wakefulness, drowsiness, and sleep are recorded.   Activating Maneuvers: None  Findings:  The EKG channel demonstrates a regular rhythm with a rate of 80 beats per minute.   The background consists predominantly of alpha activity  with frequencies ranging from 8-9 hertz. The best dominant posterior rhythm is 9-9.5 Hz. This is symmetric and reacts as expected with eye opening. There is a mild degree of intermixed theta slowing throughout the recording. Also noted is excessive beta activity with a bifrontal predominance. Voltages are mildly reduced.  There are no focal asymmetries. No epileptiform discharges are present. No seizures are recorded.   Drowsiness is recorded and is normal in appearance. Stage NI sleep is recorded and appears to be normal.    Impression:  This is essentially a normal awake, drowsy, sleep EEG. There is excessive bifrontal beta with reduced voltages.  Clinical correlation: This EEG shows nothing to suggest a increased likelihood toward seizures. The presence of excessive beta and reduced voltages is consistent with administration of benzodiazepines and/or anxiety. In this case it would seem to be the former as he has been receiving Ativan during this admission. Clinical correlation would be required.  Rhona Leavensimothy Alie Hardgrove, MD [facility]

## 2016-12-28 NOTE — H&P (Signed)
History and Physical    Ian Thornton TKZ:[id number] DOB: 11/12/1956 DOA: 12/27/2016   PCP: No primary care provider on file. Chief Complaint:  Chief Complaint  Patient presents with  . Weakness    HPI: Ian Thornton is a 61 y.o. male with medical history significant of EtOH abuse.  Patient is brought in to the ED by family members due to change in mental status with agitation, confusion, and visual hallucinations.  Unclear when patient last drank EtOH.  Had seizure 2-3 days ago.  No medical attention sought at that time.  ED Course: Patient initially confused and hallucinating, also with confabulation.  Put on CIWA and MRI obtained, patient now is sedated post 3 mg ativan.  EtOH level in ED was undetectable.  CT head demonstrates and MRI confirms a R basal ganglia acute ischemic stroke.  Unclear when patient LKW.   Review of Systems: Patient not able to participate in history at the moment.   Past Medical History:  Diagnosis Date  . ETOH abuse     History reviewed. No pertinent surgical history.   reports that he has been smoking.  He has been smoking about 0.50 packs per day. He has never used smokeless tobacco. He reports that he drinks about 9.6 oz of alcohol per week . He reports that he does not use drugs.  No Known Allergies  Family History  Problem Relation Age of Onset  . Family history unknown: Yes     Prior to Admission medications   Not on File    Physical Exam: Vitals:   12/27/16 2115 12/27/16 2230 12/27/16 2300 12/28/16 0000  BP: 172/76 154/85 133/92 133/92  Pulse: 102 (!) 47 93 100  Resp:      Temp:      TempSrc:      SpO2: (!) 85% (!) 84% 100%   Weight:          Constitutional: NAD, calm, comfortable Eyes: PERRL, lids and conjunctivae normal ENMT: Mucous membranes are moist. Posterior pharynx clear of any exudate or lesions.Normal dentition.  Neck: normal, supple, no masses, no thyromegaly Respiratory: clear to auscultation bilaterally, no  wheezing, no crackles. Normal respiratory effort. No accessory muscle use.  Cardiovascular: Regular rate and rhythm, no murmurs / rubs / gallops. No extremity edema. 2+ pedal pulses. No carotid bruits.  Abdomen: no tenderness, no masses palpated. No hepatosplenomegaly. Bowel sounds positive.  Musculoskeletal: no clubbing / cyanosis. No joint deformity upper and lower extremities. Good ROM, no contractures. Normal muscle tone.  Skin: no rashes, lesions, ulcers. No induration Neurologic: Sedated Psychiatric: Sedated   Labs on Admission: I have personally reviewed following labs and imaging studies  CBC:  Recent Labs Lab 12/27/16 1900  WBC 8.8  NEUTROABS 6.7  HGB 13.5  HCT 40.5  MCV 101.5*  PLT 84*   Basic Metabolic Panel:  Recent Labs Lab 12/27/16 1900  NA 132*  K 2.9*  CL 97*  CO2 18*  GLUCOSE 82  BUN 13  CREATININE 1.83*  CALCIUM 9.0   GFR: CrCl cannot be calculated (Unknown ideal weight.). Liver Function Tests:  Recent Labs Lab 12/27/16 1900  AST 198*  ALT 45  ALKPHOS 197*  BILITOT 1.8*  PROT 8.2*  ALBUMIN 3.6   No results for input(s): LIPASE, AMYLASE in the last 168 hours. No results for input(s): AMMONIA in the last 168 hours. Coagulation Profile:  Recent Labs Lab 12/27/16 1900  INR 1.10   Cardiac Enzymes: No results for input(s): CKTOTAL, CKMB, CKMBINDEX, TROPONINI  in the last 168 hours. BNP (last 3 results) No results for input(s): PROBNP in the last 8760 hours. HbA1C: No results for input(s): HGBA1C in the last 72 hours. CBG: No results for input(s): GLUCAP in the last 168 hours. Lipid Profile: No results for input(s): CHOL, HDL, LDLCALC, TRIG, CHOLHDL, LDLDIRECT in the last 72 hours. Thyroid Function Tests: No results for input(s): TSH, T4TOTAL, FREET4, T3FREE, THYROIDAB in the last 72 hours. Anemia Panel: No results for input(s): VITAMINB12, FOLATE, FERRITIN, TIBC, IRON, RETICCTPCT in the last 72 hours. Urine analysis:    Component  Value Date/Time   COLORURINE AMBER (A) 12/27/2016 2040   APPEARANCEUR CLOUDY (A) 12/27/2016 2040   LABSPEC 1.027 12/27/2016 2040   PHURINE 5.0 12/27/2016 2040   GLUCOSEU NEGATIVE 12/27/2016 2040   HGBUR SMALL (A) 12/27/2016 2040   BILIRUBINUR MODERATE (A) 12/27/2016 2040   KETONESUR NEGATIVE 12/27/2016 2040   PROTEINUR 100 (A) 12/27/2016 2040   NITRITE NEGATIVE 12/27/2016 2040   LEUKOCYTESUR NEGATIVE 12/27/2016 2040   Sepsis Labs: @LABRCNTIP (procalcitonin:4,lacticidven:4) )No results found for this or any previous visit (from the past 240 hour(s)).   Radiological Exams on Admission: Dg Chest 2 View  Result Date: 12/27/2016 CLINICAL DATA:  Weakness and weight loss. EXAM: CHEST  2 VIEW COMPARISON:  Report from 08/09/2000 FINDINGS: The heart size and mediastinal contours are within normal limits. The lungs are mildly hyperinflated without pneumonic consolidation, CHF nor effusion. No pneumothorax. Bilateral nipple shadows are seen over the lower lobes. The visualized skeletal structures are unremarkable. IMPRESSION: No active cardiopulmonary disease. Mild hyperinflation of the lungs. Electronically Signed   By: Tollie Ethavid  Kwon M.D.   On: 12/27/2016 19:27   Dg Abd 1 View  Result Date: 12/27/2016 CLINICAL DATA:  Pre MRI for foreign body in the intestine. EXAM: ABDOMEN - 1 VIEW COMPARISON:  None. FINDINGS: The bowel gas pattern is normal. No radio-opaque calculi nor foreign body identified. Osteophytes are seen along the dorsal spine. No acute osseous abnormality. No free air. IMPRESSION: No metallic foreign body in the abdomen or pelvis. Unremarkable bowel gas pattern. Electronically Signed   By: Tollie Ethavid  Kwon M.D.   On: 12/27/2016 23:43   Ct Head Wo Contrast  Result Date: 12/27/2016 CLINICAL DATA:  Initial evaluation for acute weakness. EXAM: CT HEAD WITHOUT CONTRAST TECHNIQUE: Contiguous axial images were obtained from the base of the skull through the vertex without intravenous contrast.  COMPARISON:  Prior CT from 05/25/2005. FINDINGS: Brain: Diffuse prominence of the CSF containing spaces is compatible with generalized cerebral atrophy. Patchy hypodensity within the periventricular and deep white matter of both cerebral hemispheres is most consistent with chronic microvascular ischemic disease. Small remote lacunar infarct within the left basal ganglia. There is evolving hypodensity involving the right basal ganglia, with involvement of the right caudate and anterior lenticular nucleus, compatible with acute ischemic infarct. No associated mass effect or hemorrhage. No other acute intracranial hemorrhage or infarct identified. No mass lesion, midline shift, or mass effect. No hydrocephalus. No extra-axial fluid collection. Vascular: No hyperdense vessel. Scattered vascular calcifications noted within the carotid siphons. Skull: Focal hyperdensity noted at the right frontal scalp, of doubtful clinical significance. Scalp soft tissues demonstrate no acute abnormality. Calvarium intact. Sinuses/Orbits: Globes and orbital soft tissues within normal limits. Paranasal sinuses and mastoid air cells are clear. IMPRESSION: 1. Evolving acute/early subacute ischemic right basal ganglia infarct. 2. Small remote lacunar infarct within the left basal ganglia. 3. Age-related cerebral atrophy with chronic microvascular ischemic disease. Electronically Signed   By:  Rise Mu M.D.   On: 12/27/2016 21:51    EKG: Independently reviewed.  Assessment/Plan Principal Problem:   Acute ischemic stroke [facility]) Active Problems:   Dysarthria   ETOH abuse   DTs (delirium tremens) (HCC)   Thrombocytopenia (HCC)    1. Acute ischemic stroke - R basal ganglia 1. Stroke pathway and workup 2. Holding off on anti platelet therapy for the moment as he has thrombocytopenia! 1. Notified Dr. Roseanne Reno of my intent to do this. 2. Therefore will defer any decision to start antiplatelets to day team / stroke team  after risks / benefits are considered more in depth. 2. DTs - 1. CIWA 2. Confirmed with RN that first dose of thiamine injection had already been given (she assured me it had). Given h/o confabulation as well in ED. 3. Currently patient is sedated, sleeping. 3. Thrombocytopenia - platelets of 80.  Strongly suspect EtOH induced liver disease in this patient (who also has AST>ALT elevation, bili 1.8, etc. 1. Confirmed with Dr. Roseanne Reno who said "hold off and let stroke team decide"   DVT prophylaxis: SCDs Code Status: Full Family Communication: No family in room Consults called: Neuro has seen patient Admission status: Admit to inpatient   Hillary Bow DO [facility] Pager [phone] from 7PM-7AM  If 7AM-7PM, please contact the day physician for the patient [email or web address] Password [email or web address]  12/28/2016, 1:29 AM

## 2016-12-28 NOTE — Progress Notes (Addendum)
STROKE TEAM PROGRESS NOTE   HISTORY OF PRESENT ILLNESS (per record) Ian Thornton is an 61 y.o. male with a history of alcohol abuse brought to the ED by family members because of a change in mental status with agitation and confusion. They also noticed visual hallucinations. It's unclear when patient last drank alcohol. He reportedly had a seizure 2-3 days ago. No medical attention was sought. Alcohol level in ED was undetectable. CT scan of his head showed a low density right basal ganglial lesion indicative of acute possible subacute ischemic stroke. It's unclear when he was last known normal. No focal weakness, facial droop is been noted by family.  LSN: Unclear tPA Given: No: Unknown when last seen well mRankin:   SUBJECTIVE (INTERVAL HISTORY) His RN is at the bedside.  Overnight he still agitated and on bilateral hand mitten. Was given ativan PRN 3 dose for agitation. As per chart, he had seizure 2-3 days ago at home, did not seek medication attention. Alcohol level not detectable. Concerning for alcohol withdraw seizure and DT. Ordered stat EEG.    OBJECTIVE Temp:  [97.7 F (36.5 C)-98 F (36.7 C)] 98 F (36.7 C) (01/28 0247) Pulse Rate:  [47-119] 119 (01/28 0247) Cardiac Rhythm: Normal sinus rhythm (01/28 0700) Resp:  [19-20] 19 (01/28 0247) BP: (93-172)/(61-103) 141/83 (01/28 0247) SpO2:  [85 %-100 %] 98 % (01/28 0247) Weight:  [62.7 kg (138 lb 3.2 oz)-63.3 kg (139 lb 9 oz)] 62.7 kg (138 lb 3.2 oz) (01/28 0247)  CBC:  Recent Labs Lab 12/27/16 1900  WBC 8.8  NEUTROABS 6.7  HGB 13.5  HCT 40.5  MCV 101.5*  PLT 84*    Basic Metabolic Panel:  Recent Labs Lab 12/27/16 1900 12/28/16 0139 12/28/16 0154  NA 132*  --  134*  K 2.9*  --  2.8*  CL 97*  --  100*  CO2 18*  --  17*  GLUCOSE 82  --  75  BUN 13  --  15  CREATININE 1.83*  --  1.32*  CALCIUM 9.0  --  8.0*  MG  --  1.6*  --     Lipid Panel:    Component Value Date/Time   CHOL 153 12/28/2016 0124   TRIG  190 (H) 12/28/2016 0124   HDL 28 (L) 12/28/2016 0124   CHOLHDL 5.5 12/28/2016 0124   VLDL 38 12/28/2016 0124   LDLCALC 87 12/28/2016 0124   HgbA1c: No results found for: HGBA1C Urine Drug Screen: No results found for: LABOPIA, COCAINSCRNUR, LABBENZ, AMPHETMU, THCU, LABBARB    IMAGING I have personally reviewed the radiological images below and agree with the radiology interpretations.  Ct Head Wo Contrast 12/27/2016 1. Evolving acute/early subacute ischemic right basal ganglia infarct.  2. Small remote lacunar infarct within the left basal ganglia.  3. Age-related cerebral atrophy with chronic microvascular ischemic disease.   Mr Maxine Glenn Head Wo Contrast 12/28/2016 MRI HEAD   1. 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 No associated hemorrhage or significant mass effect.  2. 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  MRA HEAD  1. 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  2. Widely patent right carotid artery system.  3. 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  4. Widely patent vertebrobasilar system.   EEG pending  CTA head and neck pending  TTE pending   PHYSICAL EXAM  Temp:  [97.7 F (36.5 C)-98.9 F (37.2 C)] 98.9 F (37.2 C) (01/28  0700) Pulse Rate:  [47-119] 88 (01/28 0700) Resp:  [19-20] 19 (01/28 0700) BP: (93-172)/(61-103) 147/92 (01/28 0700) SpO2:  [85 %-100 %] 100 % (01/28 0700) Weight:  [138 lb 3.2 oz (62.7 kg)-139 lb 9 oz (63.3 kg)] 138 lb 3.2 oz (62.7 kg) (01/28 0247)  General - Well nourished, well developed, mild to moderate agitation on bilateral mittens.  Ophthalmologic - Fundi not visualized due to not cooperative.  Cardiovascular - Regular rate and rhythm.  Neuro - drowsy sleepy but arousable, mild agitation on bilateral mittens. Able to follow most simple commands, orientated to place and self, but not to time or people. Able to name 2/3 and repeat simple  sentences but moderate dysarthia. No neglect, attending to both sides. PERRL, EOMI, no nystagmus seen. No significant facial asymmetry. Tongue in middle. Moving all extremities equally and symmetrically. Not cooperative on coordination test, but grossly no ataxia. Bilateral hand tremor present. Sensation intact. Gait not tested.    ASSESSMENT/PLAN Ian Thornton is a 61 y.o. male with history of alcohol abuse, smoker, seizure 2-3 days ago presenting with AMS and agitation with confusion. He did not receive IV t-PA due to unknown LSN.   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, b/l MCA mild to moderate stenosis  CTA head and neck pending  2D Echo  pending  UDS pending  LDL 87  HgbA1c pending  SCDs for VTE prophylaxis  Diet NPO time specified  No antithrombotic prior to admission, now on aspirin 300 mg suppository daily  Patient counseled to be compliant with his antithrombotic medications  Ongoing aggressive stroke risk factor management  Therapy recommendations:  pending  Disposition:  pending  Thrombocytopenia   Platelet 84  Likely due to alcohol abuse  Continue ASA for now  Hold off ASA if platelet < 50  Hypertension  BP fluctuate  Permissive hypertension (OK if < 220/120) but gradually normalize in 5-7 days  Long-term BP goal normotensive  Hyperlipidemia  Home meds:  none  LDL 87, goal < 70  Add lipitor once po access  Continue statin at discharge  Tobacco  abuse  Current smoker  Smoking cessation counseling will be provided  Other Stroke Risk Factors    Other Active Problems  Hyponatremia  Hypokalemia  Malnutrition status  Dehydration - Cre 1.83 -> 1.32 - on IVF  Hospital day # 1   The patient current situation is at risk for DT, alcohol withdraw seizure, recurrent strokes and neurological worsening and potentially life threatening complications. He needs ongoing stroke evaluation and aggressive risk factor modification.   Marvel PlanJindong Xaiver Roskelley, MD PhD Stroke Neurology 12/28/2016 10:17 AM    To contact Stroke Continuity provider, please refer to [email or web address]. After hours, contact General Neurology

## 2016-12-28 NOTE — Progress Notes (Signed)
PROGRESS NOTE                                                                                                                                                                                                             Patient Demographics:    Ian Thornton, is a 61 y.o. male, DOB - [birth year], ZOX:[id number]  Admit date - 12/27/2016   Admitting Physician Hillary Bow, DO  Outpatient Primary MD for the patient is No primary care provider on file.  LOS - 1  Outpatient Specialists:None  Chief Complaint  Patient presents with  . Weakness       Brief Narrative  61 year old male with significant alcohol abuse heavy smoker who was brought to the ED by family with change in mental status for past few days with agitation and confusion. Family also noted visual hallucination. Patient apparently had stopped drinking last few days and reportedly also had seizures 2-3 days back. In the ED vitals were stable patient was confused, hallucinating and confabulating. Alcohol level was undetectable. Placed on CIWA. MRI of the brain done showing acute right basal ganglia infarct. Admitted to stepdown unit. Stroke team consulted   Subjective:     Patient somnolent after receiving Ativan this morning. Poorly arousable.  Assessment  & Plan :    Principal Problem:   CVA (cerebral vascular accident) (HCC) Acute right basal ganglia ischemic stroke. Suspect an incidental finding since his main symptoms or alcohol withdrawal with DTs. Stroke team recommended starting on aspirin. LDL of 87 with high triglycerides. Follow A1c. PT/OT eval once hemodynamically stable and mentation better. Check 2-D echo, CT angiogram of the head and neck.  Active Problems: Alcohol abuse/withdrawal with   DTs (delirium tremens) (HCC) Monitor on CIWA. Receiving IV thiamine, folate and multivitamin.    Thrombocytopenia (HCC) Possibly alcohol-induced marrow  suppression. Monitor     Alcohol withdrawal seizure without complication (HCC)  Seizure precautions. Monitor on CIWA. Daily thiamine, folate and multivitamin. No anticonvulsants needed.  Tobacco use Place on nicotine patch   Severe hypokalemia/hypomagnesemia Replenished   transaminitis Possibly alcohol induced hepatitis. Monitor      Code Status : full Code  Family Communication  : Stepsister at bedside  Disposition Plan  : Continue step down monitoring  Barriers For Discharge : Active symptoms  Consults  :  Stroke team  Procedures  :  Head CT MRI brain 2-D echo and CT angiogram head and neck pending  DVT Prophylaxis  :  SCDs   Lab Results  Component Value Date   PLT 84 (L) 12/27/2016    Antibiotics  :    Anti-infectives    None        Objective:   Vitals:   12/28/16 0145 12/28/16 0247 12/28/16 0700 12/28/16 1100  BP: 119/72 (!) 141/83 (!) 147/92 (!) 152/90  Pulse: 88 (!) 119 88 85  Resp:  19 19 18   Temp:  98 F (36.7 C) 98.9 F (37.2 C) 98.8 F (37.1 C)  TempSrc:  Axillary Axillary Axillary  SpO2: 97% 98% 100% 95%  Weight:  62.7 kg (138 lb 3.2 oz)    Height:  5\' 10"  (1.778 m)      Wt Readings from Last 3 Encounters:  12/28/16 62.7 kg (138 lb 3.2 oz)     Intake/Output Summary (Last 24 hours) at 12/28/16 1157 Last data filed at 12/28/16 0700  Gross per 24 hour  Intake              265 ml  Output              225 ml  Net               40 ml     Physical Exam  ZOX:[id number], snoring, poorly arousable HEENT: , moist mucosa, supple neck Chest: clear b/l, no added sounds CVS: N S1&S2, no murmurs, rubs or gallop GI: soft, NT, ND, BS+ Musculoskeletal: warm, no edema CNS: moving all extremities, somnolent    Data Review:    CBC  Recent Labs Lab 12/27/16 1900  WBC 8.8  HGB 13.5  HCT 40.5  PLT 84*  MCV 101.5*  MCH 33.8  MCHC 33.3  RDW 15.7*  LYMPHSABS 1.1  MONOABS 0.9  EOSABS 0.1  BASOSABS 0.0    Chemistries    Recent Labs Lab 12/27/16 1900 12/28/16 0139 12/28/16 0154  NA 132*  --  134*  K 2.9*  --  2.8*  CL 97*  --  100*  CO2 18*  --  17*  GLUCOSE 82  --  75  BUN 13  --  15  CREATININE 1.83*  --  1.32*  CALCIUM 9.0  --  8.0*  MG  --  1.6*  --   AST 198*  --   --   ALT 45  --   --   ALKPHOS 197*  --   --   BILITOT 1.8*  --   --    ------------------------------------------------------------------------------------------------------------------  Recent Labs  12/28/16 0124  CHOL 153  HDL 28*  LDLCALC 87  TRIG 811*  CHOLHDL 5.5    No results found for: HGBA1C ------------------------------------------------------------------------------------------------------------------ No results for input(s): TSH, T4TOTAL, T3FREE, THYROIDAB in the last 72 hours.  Invalid input(s): FREET3 ------------------------------------------------------------------------------------------------------------------ No results for input(s): VITAMINB12, FOLATE, FERRITIN, TIBC, IRON, RETICCTPCT in the last 72 hours.  Coagulation profile  Recent Labs Lab 12/27/16 1900  INR 1.10    No results for input(s): DDIMER in the last 72 hours.  Cardiac Enzymes No results for input(s): CKMB, TROPONINI, MYOGLOBIN in the last 168 hours.  Invalid input(s): CK ------------------------------------------------------------------------------------------------------------------ No results found for: BNP  Inpatient Medications  Scheduled Meds: .  stroke: mapping our early stages of recovery book   Does not apply Once  . aspirin EC  325 mg Oral Daily   Or  . aspirin  300 mg Rectal  Daily  . atorvastatin  40 mg Oral q1800  . folic acid  1 mg Oral Daily  . multivitamin with minerals  1 tablet Oral Daily  . thiamine  100 mg Oral Daily   Or  . thiamine  100 mg Intravenous Daily   Continuous Infusions: . sodium chloride 75 mL/hr at 12/28/16 0945   PRN Meds:.acetaminophen **OR** acetaminophen (TYLENOL) oral  liquid 160 mg/5 mL **OR** acetaminophen, LORazepam  Micro Results Recent Results (from the past 240 hour(s))  Culture, blood (Routine x 2)     Status: None (Preliminary result)   Collection Time: 12/27/16  8:36 PM  Result Value Ref Range Status   Specimen Description BLOOD RIGHT ANTECUBITAL  Final   Special Requests IN PEDIATRIC BOTTLE 2CC  Final   Culture NO GROWTH < 24 HOURS  Final   Report Status PENDING  Incomplete  Culture, blood (Routine x 2)     Status: None (Preliminary result)   Collection Time: 12/27/16  8:36 PM  Result Value Ref Range Status   Specimen Description BLOOD RIGHT HAND  Final   Special Requests BOTTLES DRAWN AEROBIC AND ANAEROBIC 5CC  Final   Culture NO GROWTH < 24 HOURS  Final   Report Status PENDING  Incomplete  MRSA PCR Screening     Status: None   Collection Time: 12/28/16  3:02 AM  Result Value Ref Range Status   MRSA by PCR NEGATIVE NEGATIVE Final    Comment:        The GeneXpert MRSA Assay (FDA approved for NASAL specimens only), is one component of a comprehensive MRSA colonization surveillance program. It is not intended to diagnose MRSA infection nor to guide or monitor treatment for MRSA infections.     Radiology Reports Dg Chest 2 View  Result Date: 12/27/2016 CLINICAL DATA:  Weakness and weight loss. EXAM: CHEST  2 VIEW COMPARISON:  Report from 08/09/2000 FINDINGS: The heart size and mediastinal contours are within normal limits. The lungs are mildly hyperinflated without pneumonic consolidation, CHF nor effusion. No pneumothorax. Bilateral nipple shadows are seen over the lower lobes. The visualized skeletal structures are unremarkable. IMPRESSION: No active cardiopulmonary disease. Mild hyperinflation of the lungs. Electronically Signed   By: Tollie Eth M.D.   On: 12/27/2016 19:27   Dg Abd 1 View  Result Date: 12/27/2016 CLINICAL DATA:  Pre MRI for foreign body in the intestine. EXAM: ABDOMEN - 1 VIEW COMPARISON:  None. FINDINGS: The  bowel gas pattern is normal. No radio-opaque calculi nor foreign body identified. Osteophytes are seen along the dorsal spine. No acute osseous abnormality. No free air. IMPRESSION: No metallic foreign body in the abdomen or pelvis. Unremarkable bowel gas pattern. Electronically Signed   By: Tollie Eth M.D.   On: 12/27/2016 23:43   Ct Head Wo Contrast  Result Date: 12/27/2016 CLINICAL DATA:  Initial evaluation for acute weakness. EXAM: CT HEAD WITHOUT CONTRAST TECHNIQUE: Contiguous axial images were obtained from the base of the skull through the vertex without intravenous contrast. COMPARISON:  Prior CT from 05/25/2005. FINDINGS: Brain: Diffuse prominence of the CSF containing spaces is compatible with generalized cerebral atrophy. Patchy hypodensity within the periventricular and deep white matter of both cerebral hemispheres is most consistent with chronic microvascular ischemic disease. Small remote lacunar infarct within the left basal ganglia. There is evolving hypodensity involving the right basal ganglia, with involvement of the right caudate and anterior lenticular nucleus, compatible with acute ischemic infarct. No associated mass effect or hemorrhage. No other acute  intracranial hemorrhage or infarct identified. No mass lesion, midline shift, or mass effect. No hydrocephalus. No extra-axial fluid collection. Vascular: No hyperdense vessel. Scattered vascular calcifications noted within the carotid siphons. Skull: Focal hyperdensity noted at the right frontal scalp, of doubtful clinical significance. Scalp soft tissues demonstrate no acute abnormality. Calvarium intact. Sinuses/Orbits: Globes and orbital soft tissues within normal limits. Paranasal sinuses and mastoid air cells are clear. IMPRESSION: 1. Evolving acute/early subacute ischemic right basal ganglia infarct. 2. Small remote lacunar infarct within the left basal ganglia. 3. Age-related cerebral atrophy with chronic microvascular ischemic  disease. Electronically Signed   By: Rise Mu M.D.   On: 12/27/2016 21:51   Mr Maxine Glenn Head Wo Contrast  Result Date: 12/28/2016 CLINICAL DATA:  Initial evaluation for acute stroke. EXAM: MRI HEAD WITHOUT CONTRAST MRA HEAD WITHOUT CONTRAST TECHNIQUE: Multiplanar, multiecho pulse sequences of the brain and surrounding structures were obtained without intravenous contrast. Angiographic images of the head were obtained using MRA technique without contrast. COMPARISON:  Prior CT from earlier the same day. FINDINGS: MRI HEAD FINDINGS Brain: Study degraded by motion artifact. Mild diffuse prominence of the CSF containing spaces is compatible with generalized age-related cerebral atrophy. Patchy T2/FLAIR hyperintensity within the periventricular and deep white matter both cerebral hemispheres most compatible chronic microvascular ischemic disease, mild to moderate in nature. Abnormal restricted diffusion involves the right basal ganglia, specifically involving the anterior right lentiform nucleus and right caudate head. Involvement of the anterior limb of the right internal capsule. Associated T2/FLAIR signal abnormality without significant mass effect. This is largely acute in appearance on this exam. No associated hemorrhage. No other evidence for acute ischemia. No mass lesion, midline shift or mass effect. No hydrocephalus. No extra-axial fluid collection. Major dural sinuses are grossly patent. No intrinsic temporal lobe abnormality. Pituitary gland and suprasellar region within normal limits. Vascular: Abnormal flow void within the left ICA to the terminus. Major intracranial vascular flow voids otherwise maintained. Skull and upper cervical spine: Craniocervical junction normal. Visualized upper cervical spine unremarkable. Bone marrow signal intensity within normal limits. No scalp soft tissue abnormality. Sinuses/Orbits: Globes and orbital soft tissues within normal limits. Scattered mucosal thickening  within the ethmoidal air cells and maxillary sinuses. No air-fluid level to suggest active sinus infection. No mastoid effusion. Inner ear structures grossly normal. MRA HEAD FINDINGS ANTERIOR CIRCULATION: Study degraded by motion artifact. Left ICA is occluded to the level of the terminus. Distal cervical right ICA widely patent with antegrade flow. Petrous, cavernous, and supraclinoid right ICA patent without high-grade stenosis. Right A1 segment dominant and widely patent. There is a patent hypoplastic left A1 segment. Anterior communicating artery normal. Anterior cerebral arteries patent to their distal aspects with suspected multifocal atheromatous irregularity. Left A1 segment patent. There is an apparent short-segment severe proximal left M2 stenosis (series 13, image 37). Small vessel atheromatous irregularity distally within the left MCA branches. Short-segment moderate stenosis within the distal right M1 segment (series 13, image 71). Distal small vessel atheromatous irregularity within the right MCA branches. POSTERIOR CIRCULATION: Vertebral arteries code dominant and patent to the vertebrobasilar junction. Posterior inferior cerebral arteries not well evaluated on this motion degraded study. Basilar artery mildly tortuous but widely patent to its distal aspect. Superior cerebral arteries patent bilaterally. Posterior cerebral arteries largely supplied via the basilar artery and are widely patent to their distal aspects. Small bilateral posterior communicating arteries noted. No aneurysm or vascular malformation. IMPRESSION: MRI HEAD IMPRESSION: 1. 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 No  associated hemorrhage or significant mass effect. 2. 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 MRA HEAD IMPRESSION: 1. 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 2. Widely patent right carotid artery system. 3. Moderate multifocal atheromatous disease involving the  anterior circulation as above. 4. Widely patent vertebrobasilar system. Electronically Signed   By: Rise MuBenjamin  McClintock M.D.   On: 12/28/2016 01:30   Mr Brain Wo Contrast (neuro Protocol)  Result Date: 12/28/2016 CLINICAL DATA:  Initial evaluation for acute stroke. EXAM: MRI HEAD WITHOUT CONTRAST MRA HEAD WITHOUT CONTRAST TECHNIQUE: Multiplanar, multiecho pulse sequences of the brain and surrounding structures were obtained without intravenous contrast. Angiographic images of the head were obtained using MRA technique without contrast. COMPARISON:  Prior CT from earlier the same day. FINDINGS: MRI HEAD FINDINGS Brain: Study degraded by motion artifact. Mild diffuse prominence of the CSF containing spaces is compatible with generalized age-related cerebral atrophy. Patchy T2/FLAIR hyperintensity within the periventricular and deep white matter both cerebral hemispheres most compatible chronic microvascular ischemic disease, mild to moderate in nature. Abnormal restricted diffusion involves the right basal ganglia, specifically involving the anterior right lentiform nucleus and right caudate head. Involvement of the anterior limb of the right internal capsule. Associated T2/FLAIR signal abnormality without significant mass effect. This is largely acute in appearance on this exam. No associated hemorrhage. No other evidence for acute ischemia. No mass lesion, midline shift or mass effect. No hydrocephalus. No extra-axial fluid collection. Major dural sinuses are grossly patent. No intrinsic temporal lobe abnormality. Pituitary gland and suprasellar region within normal limits. Vascular: Abnormal flow void within the left ICA to the terminus. Major intracranial vascular flow voids otherwise maintained. Skull and upper cervical spine: Craniocervical junction normal. Visualized upper cervical spine unremarkable. Bone marrow signal intensity within normal limits. No scalp soft tissue abnormality. Sinuses/Orbits:  Globes and orbital soft tissues within normal limits. Scattered mucosal thickening within the ethmoidal air cells and maxillary sinuses. No air-fluid level to suggest active sinus infection. No mastoid effusion. Inner ear structures grossly normal. MRA HEAD FINDINGS ANTERIOR CIRCULATION: Study degraded by motion artifact. Left ICA is occluded to the level of the terminus. Distal cervical right ICA widely patent with antegrade flow. Petrous, cavernous, and supraclinoid right ICA patent without high-grade stenosis. Right A1 segment dominant and widely patent. There is a patent hypoplastic left A1 segment. Anterior communicating artery normal. Anterior cerebral arteries patent to their distal aspects with suspected multifocal atheromatous irregularity. Left A1 segment patent. There is an apparent short-segment severe proximal left M2 stenosis (series 13, image 37). Small vessel atheromatous irregularity distally within the left MCA branches. Short-segment moderate stenosis within the distal right M1 segment (series 13, image 71). Distal small vessel atheromatous irregularity within the right MCA branches. POSTERIOR CIRCULATION: Vertebral arteries code dominant and patent to the vertebrobasilar junction. Posterior inferior cerebral arteries not well evaluated on this motion degraded study. Basilar artery mildly tortuous but widely patent to its distal aspect. Superior cerebral arteries patent bilaterally. Posterior cerebral arteries largely supplied via the basilar artery and are widely patent to their distal aspects. Small bilateral posterior communicating arteries noted. No aneurysm or vascular malformation. IMPRESSION: MRI HEAD IMPRESSION: 1. 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 No associated hemorrhage or significant mass effect. 2. 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 MRA HEAD IMPRESSION: 1. 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 2. Widely patent right  carotid artery system. 3. Moderate multifocal atheromatous disease involving the anterior circulation  as above. 4. Widely patent vertebrobasilar system. Electronically Signed   By: Rise Mu M.D.   On: 12/28/2016 01:30    Time Spent in minutes  25   Eddie North M.D on 12/28/2016 at 11:57 AM  Between 7am to 7pm - Pager - [phone]  After 7pm go to [email or web address] - password [email or web address]  [facility] -  Office  [phone]

## 2016-12-28 NOTE — Progress Notes (Signed)
Bedside EEG completed, results pending. 

## 2016-12-28 NOTE — ED Notes (Signed)
Attempted to call report.  Admitting MD paged to discuss increasing level of care

## 2016-12-28 NOTE — ED Notes (Signed)
Patient returned from MRI.

## 2016-12-29 DIAGNOSIS — E784 Other hyperlipidemia: Secondary | ICD-10-CM

## 2016-12-29 DIAGNOSIS — I63512 Cerebral infarction due to unspecified occlusion or stenosis of left middle cerebral artery: Secondary | ICD-10-CM

## 2016-12-29 DIAGNOSIS — I63231 Cerebral infarction due to unspecified occlusion or stenosis of right carotid arteries: Secondary | ICD-10-CM

## 2016-12-29 DIAGNOSIS — I6521 Occlusion and stenosis of right carotid artery: Secondary | ICD-10-CM

## 2016-12-29 LAB — URINE CULTURE

## 2016-12-29 LAB — COMPREHENSIVE METABOLIC PANEL
ALBUMIN: 2.8 g/dL — AB (ref 3.5–5.0)
ALK PHOS: 112 U/L (ref 38–126)
ALT: 38 U/L (ref 17–63)
AST: 123 U/L — AB (ref 15–41)
Anion gap: 6 (ref 5–15)
BUN: <5 mg/dL — ABNORMAL LOW (ref 6–20)
CALCIUM: 7.9 mg/dL — AB (ref 8.9–10.3)
CHLORIDE: 108 mmol/L (ref 101–111)
CO2: 21 mmol/L — AB (ref 22–32)
CREATININE: 0.55 mg/dL — AB (ref 0.61–1.24)
GFR calc Af Amer: >60 mL/min (ref >60)
GFR calc non Af Amer: >60 mL/min (ref >60)
GLUCOSE: 96 mg/dL (ref 65–99)
Potassium: 2.9 mmol/L — ABNORMAL LOW (ref 3.5–5.1)
SODIUM: 135 mmol/L (ref 135–145)
Total Bilirubin: 1.1 mg/dL (ref 0.3–1.2)
Total Protein: 6.6 g/dL (ref 6.5–8.1)

## 2016-12-29 LAB — CBC
HCT: 34.3 % — ABNORMAL LOW (ref 39.0–52.0)
HEMOGLOBIN: 11.4 g/dL — AB (ref 13.0–17.0)
MCH: 33.3 pg (ref 26.0–34.0)
MCHC: 33.2 g/dL (ref 30.0–36.0)
MCV: 100.3 fL — ABNORMAL HIGH (ref 78.0–100.0)
PLATELETS: 76 10*3/uL — AB (ref 150–400)
RBC: 3.42 MIL/uL — AB (ref 4.22–5.81)
RDW: 15.9 % — ABNORMAL HIGH (ref 11.5–15.5)
WBC: 5.8 10*3/uL (ref 4.0–10.5)

## 2016-12-29 LAB — HEMOGLOBIN A1C
HEMOGLOBIN A1C: 5.3 % (ref 4.8–5.6)
Mean Plasma Glucose: 105 mg/dL

## 2016-12-29 LAB — MAGNESIUM: Magnesium: 2.2 mg/dL (ref 1.7–2.4)

## 2016-12-29 MED ORDER — SODIUM CHLORIDE 0.9 % IV SOLN
30.0000 meq | Freq: Three times a day (TID) | INTRAVENOUS | Status: DC
  Administered 2016-12-29: 30 meq via INTRAVENOUS
  Filled 2016-12-29 (×2): qty 15

## 2016-12-29 MED ORDER — NICOTINE 21 MG/24HR TD PT24
21.0000 mg | MEDICATED_PATCH | Freq: Every day | TRANSDERMAL | Status: DC
  Administered 2016-12-29 – 2017-01-02 (×5): 21 mg via TRANSDERMAL
  Filled 2016-12-29 (×5): qty 1

## 2016-12-29 MED ORDER — ENSURE ENLIVE PO LIQD
237.0000 mL | Freq: Two times a day (BID) | ORAL | Status: DC
  Administered 2016-12-29 – 2017-01-02 (×9): 237 mL via ORAL

## 2016-12-29 MED ORDER — POTASSIUM CHLORIDE CRYS ER 20 MEQ PO TBCR
40.0000 meq | EXTENDED_RELEASE_TABLET | Freq: Once | ORAL | Status: AC
  Administered 2016-12-29: 40 meq via ORAL
  Filled 2016-12-29: qty 2

## 2016-12-29 MED ORDER — PNEUMOCOCCAL VAC POLYVALENT 25 MCG/0.5ML IJ INJ
0.5000 mL | INJECTION | INTRAMUSCULAR | Status: AC
  Administered 2016-12-30: 0.5 mL via INTRAMUSCULAR
  Filled 2016-12-29: qty 0.5

## 2016-12-29 MED ORDER — THIAMINE HCL 100 MG/ML IJ SOLN: 500.0000 mg | Freq: Three times a day (TID) | INTRAVENOUS | Status: DC

## 2016-12-29 MED ORDER — INFLUENZA VAC SPLIT QUAD 0.5 ML IM SUSY
0.5000 mL | PREFILLED_SYRINGE | INTRAMUSCULAR | Status: AC
  Administered 2016-12-30: 0.5 mL via INTRAMUSCULAR
  Filled 2016-12-29: qty 0.5

## 2016-12-29 MED ORDER — VITAMIN B-1 100 MG PO TABS
100.0000 mg | ORAL_TABLET | Freq: Every day | ORAL | Status: DC
  Administered 2016-12-29 – 2017-01-02 (×5): 100 mg via ORAL
  Filled 2016-12-29 (×5): qty 1

## 2016-12-29 NOTE — Progress Notes (Signed)
Report called pt to transfer to [phone] via w/c with belongings.

## 2016-12-29 NOTE — Care Management Note (Addendum)
Case Management Note  Patient Details  Name: Ian Thornton MRN: [id number] Date of Birth: 06/10/1956  Subjective/Objective:   Presents with CVA, ETOH abuse and withdrawal on CIWA, with DT's, Thrombocytopenia, severe hypokalemia/hypomanesemai, patient states he lives with his brother, [name].  He has no PCP, he states he has [employer] insurance, states he goes to [facility].  NCM contacted [place] [facility], she states he is not listed in their system , she informed NCM to call [facility].  NCM called [facility] , did not get an answer, left VM for a return call back .  PT is rec SNF ,CSW aware, NCM will cont to follow for dc needs.      1/29 1422 Letha Capeeborah Yeily Link RN, BSN - NCM called April at [facility] again and left another message, still have not received a return call to verify [facility] status.    1/29 1509 Letha Capeeborah Halima Fogal RN, BSN - NCM received call from April with [facility], she states patient is not service connected with them, he does not have any insurance, patient will need to get a PCP and get established with them  Again.  Per [facility] at [facility] , if -patient will need short term snf patient will have to fill out paperwork and send back in , NCM notify CSW.              Action/Plan:   Expected Discharge Date:                  Expected Discharge Plan:  Skilled Nursing Facility  In-House Referral:  Clinical Social Work  Discharge planning Services  CM Consult  Post Acute Care Choice:    Choice offered to:     DME Arranged:    DME Agency:     HH Arranged:    HH Agency:     Status of Service:  In process, will continue to follow  If discussed at Long Length of Stay Meetings, dates discussed:    Additional Comments:  Leone Havenaylor, Stavroula Rohde Clinton, RN 12/29/2016, 11:27 AM

## 2016-12-29 NOTE — Evaluation (Signed)
Speech Language Pathology Evaluation Patient Details Name: Ian FeathersBernard Carcione MRN: [id number] DOB: [birth year] Today's Date: 12/29/2016 Time: [id number] SLP Time Calculation (min) (ACUTE ONLY): 21 min  Problem List:  Patient Active Problem List   Diagnosis Date Noted  . CVA (cerebral vascular accident) (HCC) 12/28/2016  . ETOH abuse 12/28/2016  . DTs (delirium tremens) (HCC) 12/28/2016  . Thrombocytopenia (HCC) 12/28/2016  . Alcohol withdrawal seizure without complication (HCC)   . Hyperlipidemia   . Smoker   . Acute ischemic left MCA stroke (HCC)   . Hypokalemia   . Hypomagnesemia   . Dysarthria 12/27/2016   Past Medical History:  Past Medical History:  Diagnosis Date  . ETOH abuse    Past Surgical History: History reviewed. No pertinent surgical history. HPI:  Pt is a 61 y.o.malewith a history of alcohol abuse brought to the ED by family members on 12/27/16 because of a change in mental status with agitation and confusion. They also noticed visual hallucinations. It's unclear when patient last drank alcohol. He reportedly had a seizure 2-3 days ago. No medical attention was sought. Alcohol level in ED was undetectable. CT scan of his head showed a low density right basal ganglial lesion indicative of acute possible subacute ischemic stroke. It's unclear when he was last known normal. No focal weakness, facial droop has been noted by family.   Assessment / Plan / Recommendation Clinical Impression  Patient presents with cognitive communication impairment with primary deficits areas in orientation, attention, awareness and safety/judgement. Administered MOCA; patient scored 14/30, indicating severe impairment. While testing showed deficits in visuospatial/executive, language, delayed recall, clinical impression is patient's performance impacted by severe attention deficits including reduced attention to testing instructions. Patient's baseline function is difficult to determine due to  unavailability of family members and patient's poor awareness of deficits. Per patient report, it appears he was living with his brother prior to hospitalization and receiving some level of assistance with IADLs. Recommend skilled SLP to address deficits areas and to improve awareness, safety during hospitalization. SLP will follow acutely.    SLP Assessment  Patient needs continued Speech Lanaguage Pathology Services    Follow Up Recommendations  24 hour supervision/assistance    Frequency and Duration min 2x/week  2 weeks      SLP Evaluation Cognition  Overall Cognitive Status: Impaired/Different from baseline Arousal/Alertness: Awake/alert Orientation Level: Oriented to person;Disoriented to time;Disoriented to situation;Oriented to place Attention: Focused;Sustained;Selective Focused Attention: Impaired Focused Attention Impairment: Verbal basic Sustained Attention: Impaired Sustained Attention Impairment: Verbal basic Selective Attention: Impaired Selective Attention Impairment: Verbal basic Memory:  (Impaired delayed recall, suspect 2/2 attention deficits) Awareness: Impaired Awareness Impairment: Emergent impairment Safety/Judgment: Impaired Comments: see clinical impression       Comprehension  Auditory Comprehension Overall Auditory Comprehension: Appears within functional limits for tasks assessed Visual Recognition/Discrimination Discrimination: Not tested Reading Comprehension Reading Status: Not tested    Expression Expression Primary Mode of Expression: Verbal Verbal Expression Overall Verbal Expression: Appears within functional limits for tasks assessed Written Expression Dominant Hand: Right Written Expression: Not tested   Oral / Motor  Oral Motor/Sensory Function Overall Oral Motor/Sensory Function: Within functional limits Motor Speech Overall Motor Speech: Appears within functional limits for tasks assessed   GO                   Rondel BatonMary Beth  Nykolas Bacallao, MS CF-SLP Speech-Language Pathologist [phone]  Arlana LindauMary E Emmit Oriley 12/29/2016, 4:22 PM

## 2016-12-29 NOTE — Progress Notes (Signed)
STROKE TEAM PROGRESS NOTE   SUBJECTIVE (INTERVAL HISTORY) No family at bedside. Pt is lying in bed comfortably. His condition much improved from yesterday, no agitation. HR normalized but still has mild postural tremor on raising arms. CTA neck showed left ICA occlusion and right ICA high grade stenosis.    OBJECTIVE Temp:  [97.5 F (36.4 C)-99 F (37.2 C)] 99 F (37.2 C) (01/29 1109) Pulse Rate:  [86-97] 96 (01/29 1109) Cardiac Rhythm: Normal sinus rhythm (01/29 1109) Resp:  [15-19] 19 (01/29 1109) BP: (126-171)/(75-99) 138/78 (01/29 1109) SpO2:  [91 %-100 %] 100 % (01/29 1109)  CBC:   Recent Labs Lab 12/27/16 1900 12/29/16 0418  WBC 8.8 5.8  NEUTROABS 6.7  --   HGB 13.5 11.4*  HCT 40.5 34.3*  MCV 101.5* 100.3*  PLT 84* 76*    Basic Metabolic Panel:  Recent Labs Lab 12/28/16 0139  12/28/16 1234 12/29/16 0418 12/29/16 0923  NA  --   < > 134* 135  --   K  --   < > 3.0* 2.9*  --   CL  --   < > 104 108  --   CO2  --   < > 18* 21*  --   GLUCOSE  --   < > 70 96  --   BUN  --   < > 10 <5*  --   CREATININE  --   < > 0.85 0.55*  --   CALCIUM  --   < > 8.2* 7.9*  --   MG 1.6*  --   --   --  2.2  < > = values in this interval not displayed.  Lipid Panel:     Component Value Date/Time   CHOL 153 12/28/2016 0124   TRIG 190 (H) 12/28/2016 0124   HDL 28 (L) 12/28/2016 0124   CHOLHDL 5.5 12/28/2016 0124   VLDL 38 12/28/2016 0124   LDLCALC 87 12/28/2016 0124   HgbA1c:  Lab Results  Component Value Date   HGBA1C 5.3 12/28/2016   Urine Drug Screen:     Component Value Date/Time   LABOPIA NONE DETECTED 12/28/2016 1548   COCAINSCRNUR NONE DETECTED 12/28/2016 1548   LABBENZ POSITIVE (A) 12/28/2016 1548   AMPHETMU NONE DETECTED 12/28/2016 1548   THCU NONE DETECTED 12/28/2016 1548   LABBARB NONE DETECTED 12/28/2016 1548      IMAGING I have personally reviewed the radiological images below and agree with the radiology interpretations.  Ct Head Wo  Contrast 12/27/2016 1. Evolving acute/early subacute ischemic right basal ganglia infarct.  2. Small remote lacunar infarct within the left basal ganglia.  3. Age-related cerebral atrophy with chronic microvascular ischemic disease.   MRI HEAD   12/28/2016 1. 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 No associated hemorrhage or significant mass effect.  2. 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   MRA HEAD  12/28/2016 1. 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  2. Widely patent right carotid artery system.  3. 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  4. Widely patent vertebrobasilar system.   EEG  This is essentially a normal awake, drowsy, sleep EEG. There is excessive bifrontal beta with reduced voltages. This EEG shows nothing to suggest a increased likelihood toward seizures. The presence of excessive beta and reduced voltages is consistent with administration of benzodiazepines and/or anxiety. In this case it would seem to be the former as he has been receiving Ativan  during this admission. Clinical correlation would be required.  CTA head and neck 1. Right anterior basal ganglia acute infarction stable in size in comparison with prior MRI. No hemorrhage identified.  2. 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 This may represent sequelae of age indeterminate dissection or atherosclerotic disease. 3. Proximal right ICA severe 90% stenosis secondary to dense calcified plaque. 4. Patent vertebral arteries in the neck without high-grade stenosis. 5. Motion degraded CTA of the head. No high-grade stenosis, proximal occlusion, aneurysm, or vascular malformation of the anterior cerebral arteries, middle cerebral arteries, or posterior circulation identified. 6. 4 mm pulmonary nodules in the right upper and right lower lobe. No  follow-up needed if patient is low-risk (and has no known or suspected primary neoplasm). Non-contrast chest CT can be considered  TTE  - Left ventricle: The cavity size was normal. Wall thickness was increased in a pattern of mild LVH. There was mild focal basal hypertrophy of the septum. Systolic function was normal. The estimated ejection fraction was in the range of 50% to 55%. Wall motion was normal; there were no regional wall motion abnormalities. Doppler parameters are consistent with abnormal left ventricular relaxation (grade 1 diastolic dysfunction). - Mitral valve: Calcified annulus. Impressions:   Normal LV systolic function; grade 1 diastolic dysfunction.   PHYSICAL EXAM  Temp:  [97.5 F (36.4 C)-99 F (37.2 C)] 99 F (37.2 C) (01/29 1109) Pulse Rate:  [86-97] 96 (01/29 1109) Resp:  [15-19] 19 (01/29 1109) BP: (126-171)/(75-99) 138/78 (01/29 1109) SpO2:  [91 %-100 %] 100 % (01/29 1109)  General - Well nourished, well developed, mild to moderate agitation on bilateral mittens.  Ophthalmologic - Fundi not visualized due to not cooperative.  Cardiovascular - Regular rate and rhythm.  Neuro - drowsy sleepy but arousable, mild agitation on bilateral mittens. Able to follow most simple commands, orientated to place and self, but not to time or people. Able to name 2/3 and repeat simple sentences but moderate dysarthia. No neglect, attending to both sides. PERRL, EOMI, no nystagmus seen. No significant facial asymmetry. Tongue in middle. Moving all extremities equally and symmetrically. Not cooperative on coordination test, but grossly no ataxia. Bilateral hand tremor present. Sensation intact. Gait not tested.    ASSESSMENT/PLAN Mr. Jamani Bearce is a 61 y.o. male with history of alcohol abuse, smoker, seizure 2-3 days ago presenting with AMS and agitation with confusion. He did not receive IV t-PA due to unknown LSN.   Delirium tremor/alcohol abuse  Confusion and agitation at  admission and yesterday, but much improved today  HR tachycardia -> normalized  B/l hand tremor present, improved  Hx of alcohol abuse  recent seizure likely alcohol withdraw seizure  On IVF  On FA, B1 and MV  CIWA protocol  EEG stat no seizures  Stroke:  Incidental finding, right BG/caudate head acute infarct, likely secondary to small vessel disease source  Resultant  No focal neuro deficit  MRI  Right caudate head acute infarct, old left BG infarct  MRA  Left ICA occlusion likely chronic, b/l MCA mild to moderate stenosis  CTA head and neck L ICA occluded, but R ICA 90% stenosis d/t plaque  2D Echo  EF 50-55%. No source of embolus   Recommend vascular surgery consultation to consider right CEA vs. Stenting.   LDL 87  HgbA1c 5.3  SCDs for VTE prophylaxis Diet regular Room service appropriate? Yes; Fluid consistency: Thin  No antithrombotic prior to  admission, now on aspirin 325 mg daily. Continue ASA. Do not recommend DAPT due to thrombocytopenia.  Patient counseled to be compliant with his antithrombotic medications  Ongoing aggressive stroke risk factor management  Therapy recommendations:  SNF  Disposition:  pending   Carotid occlusion/stenosis  CTA head and neck -  L ICA occluded, but R ICA 90% stenosis d/t plaque  Symptomatic right ICA stenosis  Recommend vascular surgery consultation for right ICA high grade stenosis. Due to left ICA occlusion, may consider right ICA stenting.   Continue ASA, do not recommend DAPT due to thrombocytopenia.  Thrombocytopenia   Platelet 84->76  Likely due to alcohol abuse  Continue ASA for now  Hold off ASA if platelet < 50  Hypertension  BP fluctuate  Permissive hypertension (OK if < 220/120) but gradually normalize in 5-7 days  Long-term BP goal normotensive  Hyperlipidemia  Home meds:  none  LDL 87, goal < 70  lipitor 40 mg daily   Continue statin at discharge  Tobacco abuse  Current  smoker  Smoking cessation counseling will be provided  Other Active Problems  Hyponatremia  Hypokalemia  Malnutrition   Dehydration - Cre 1.83 -> 1.32 ->0/85 -> 0.55   Hospital day # 2   Marvel Plan, MD PhD Stroke Neurology 12/29/2016 12:26 PM   To contact Stroke Continuity provider, please refer to [email or web address]. After hours, contact General Neurology

## 2016-12-29 NOTE — Progress Notes (Signed)
NURSING PROGRESS NOTE  Ian FeathersBernard Marrazzo [id number] Transfer Data: 12/29/2016 3:25 PM Attending Provider: Eddie NorthNishant Dhungel, MD PCP:No primary care provider on file. Code Status: Full  Ian Thornton is a 61 y.o. male patient transferred from [address]. -No acute distress noted.  -No complaints of shortness of breath.  -No complaints of chest pain.   Cardiac Monitoring: Box # 23 in place. Cardiac monitor yields SR-ST in 90's  Blood pressure (!) 172/96, pulse 91, temperature (!) 100.5 F (38.1 C), temperature source Oral, resp. rate 20, height 5\' 10"  (1.778 m), weight 62.7 kg (138 lb 3.2 oz), SpO2 100 %.   IV Fluids:  IV in place, Left fore-arm occlusive dsg intact without redness, IV cath  Allergies:  Patient has no known allergies.  Past Medical History:   has a past medical history of ETOH abuse.  Past Surgical History:   has no past surgical history on file.  Social History:   reports that he has been smoking.  He has been smoking about 0.50 packs per day. He has never used smokeless tobacco. He reports that he drinks about 9.6 oz of alcohol per week . He reports that he does not use drugs.  Skin: Clean dry & Intact, no skin issues or breakdown noted.  Patient/Family orientated to room. Admission inpatient armband information verified to be correct with patient. Side rails up x 2, fall assessment and education completed with patient/family. Patient/family able to verbalize understanding of risk associated with falls and verbalized understanding to call for assistance before getting out of bed. Call light within reach. Patient/family able to voice and demonstrate understanding of unit orientation instructions.  Pt is A & O to place and situation. Pt is impulsive, upon arriving he immediately jumped out of bed and began heading toward the bathroom. reminded pt he needs to call when getting up. Bed alarm has been turned on to alert when pt attempts to get up.  Will continue to evaluate and treat  per MD orders.

## 2016-12-29 NOTE — Evaluation (Signed)
Occupational Therapy Evaluation Patient Details Name: Ian Thornton MRN: [id number] DOB:Ian Thornton Dec 06, 1955 Today's Date: 12/29/2016    History of Present Illness Ian LennertBernard Thornton an 61 y.o.malewith a history of alcohol abuse brought to the ED by family members on 12/27/16 because of a change in mental status with agitation and confusion. They also noticed visual hallucinations. It's unclear when patient last drank alcohol. He reportedly had a seizure 2-3 days ago. No medical attention was sought. Alcohol level in ED was undetectable. CT scan of his head showed a low density right basal ganglial lesion indicative of acute possible subacute ischemic stroke. It's unclear when he was last known normal. No focal weakness, facial droop has been noted by family.   Clinical Impression   Pt was independent prior to admission. Presents with impaired cognition, decreased standing balance and tremor. He has poor awareness of his deficits. Recommending 24 hour supervision at home and brother works. Will follow.   Follow Up Recommendations  SNF;Supervision/Assistance - 24 hour    Equipment Recommendations       Recommendations for Other Services       Precautions / Restrictions Precautions Precautions: Fall Restrictions Weight Bearing Restrictions: No      Mobility Bed Mobility Overal bed mobility: Needs Assistance Bed Mobility: Supine to Sit     Supine to sit: Min guard     General bed mobility comments: HOB up  Transfers Overall transfer level: Needs assistance Equipment used: 1 person hand held assist Transfers: Sit to/from UGI CorporationStand;Stand Pivot Transfers Sit to Stand: Min assist Stand pivot transfers: Min assist       General transfer comment: pt very unsteady with transfers    Balance Overall balance assessment: Needs assistance   Sitting balance-Leahy Scale: Fair       Standing balance-Leahy Scale: Poor                              ADL Overall ADL's : Needs  assistance/impaired Eating/Feeding: Independent;Sitting   Grooming: Wash/dry hands;Sitting;Set up   Upper Body Bathing: Sitting;Minimal assistance   Lower Body Bathing: Minimal assistance;Sit to/from stand   Upper Body Dressing : Minimal assistance;Sitting   Lower Body Dressing: Sit to/from stand;Minimal assistance   Toilet Transfer: Minimal assistance;Stand-pivot;BSC   Toileting- Clothing Manipulation and Hygiene: Set up;Sitting/lateral lean Toileting - Clothing Manipulation Details (indicate cue type and reason): performed pericare             Vision     Perception     Praxis      Pertinent Vitals/Pain Pain Assessment: No/denies pain     Hand Dominance Right   Extremity/Trunk Assessment Upper Extremity Assessment Upper Extremity Assessment: Overall WFL for tasks assessed (tremor)   Lower Extremity Assessment Lower Extremity Assessment: Defer to PT evaluation       Communication Communication Communication: No difficulties   Cognition Arousal/Alertness: Awake/alert Behavior During Therapy: WFL for tasks assessed/performed Overall Cognitive Status: Impaired/Different from baseline Area of Impairment: Orientation;Attention;Memory;Safety/judgement;Problem solving Orientation Level: Disoriented to;Time;Situation Current Attention Level: Sustained Memory: Decreased short-term memory   Safety/Judgement: Decreased awareness of safety;Decreased awareness of deficits   Problem Solving: Slow processing;Decreased initiation;Difficulty sequencing General Comments: denies he was confused upon admission   General Comments       Exercises       Shoulder Instructions      Home Living Family/patient expects to be discharged to:: Private residence Living Arrangements: Other relatives (brother) Available Help at Discharge: Family;Available PRN/intermittently (  brother works) Type of Home: [employer] Access: Stairs to enter Secretary/administrator of Steps: 4    Home Layout: Other (Comment);Multi-level (split level)     Bathroom Shower/Tub: Chief Strategy Officer: Standard     Home Equipment: None   Additional Comments: reports he drives      Prior Functioning/Environment Level of Independence: Independent        Comments: reports he drives        OT Problem List: Impaired balance (sitting and/or standing);Decreased cognition;Decreased safety awareness;Decreased knowledge of use of DME or AE   OT Treatment/Interventions: Self-care/ADL training;DME and/or AE instruction;Patient/family education;Balance training;Cognitive remediation/compensation    OT Goals(Current goals can be found in the care plan section) Acute Rehab OT Goals Patient Stated Goal: None stated OT Goal Formulation: With patient Time For Goal Achievement: 01/05/17 Potential to Achieve Goals: Good ADL Goals Pt Will Perform Grooming: with supervision;standing Pt Will Perform Upper Body Dressing: with supervision;sitting Pt Will Perform Lower Body Dressing: with supervision;sit to/from stand Pt Will Transfer to Toilet: with supervision;ambulating;regular height toilet Pt Will Perform Toileting - Clothing Manipulation and hygiene: with supervision;sit to/from stand Additional ADL Goal #1: Pt will demonstrate selective attention during ADL.  OT Frequency: Min 2X/week   Barriers to D/C:            Co-evaluation              End of Session Equipment Utilized During Treatment: Gait belt Nurse Communication:  (IV out)  Activity Tolerance: Patient tolerated treatment well Patient left: in chair;with call bell/phone within reach;with chair alarm set   Time: [phone] OT Time Calculation (min): 42 min Charges:  OT General Charges $OT Visit: 1 Procedure OT Evaluation $OT Eval Moderate Complexity: 1 Procedure OT Treatments $Self Care/Home Management : 23-37 mins G-Codes:    Evern Bio 12/29/2016, 10:56 AM [phone]

## 2016-12-29 NOTE — Progress Notes (Addendum)
PROGRESS NOTE                                                                                                                                                                                                             Patient Demographics:    Ian Thornton, is a 61 y.o. male, DOB - 1956-05-21, ZOX:[id number]  Admit date - 12/27/2016   Admitting Physician Hillary Bow, DO  Outpatient Primary MD for the patient is No primary care provider on file.  LOS - 2  Outpatient Specialists:None  Chief Complaint  Patient presents with  . Weakness       Brief Narrative  61 year old male with significant alcohol abuse heavy smoker who was brought to the ED by family with change in mental status for past few days with agitation and confusion. Family also noted visual hallucination. Patient apparently had stopped drinking last few days and reportedly also had seizures 2-3 days back. In the ED vitals were stable patient was confused, hallucinating and confabulating. Alcohol level was undetectable. Placed on CIWA. MRI of the brain done showing acute right basal ganglia infarct. Admitted to stepdown unit. Stroke team consulted.   Subjective:   Much improved today. Has some tremors. Oriented.   Assessment  & Plan :    Principal Problem:   CVA (cerebral vascular accident) (HCC) Acute right basal ganglia ischemic stroke. Suspect an incidental finding since his main symptoms or alcohol withdrawal with DTs. Stroke team following. Started  on aspirin. Recommend against DAPT due to thrombocytopenia. LDL of 87 with high triglycerides.  A1c 5.3. PT/OT eval . Check 2-D echo with normal EF and no wall motion abnormality, no cardiac source of emboli., CT angiogram of the head and neck shows 90% stenosis of rt ICA.  vascular surgery consult .  Active Problems: Alcohol abuse/withdrawal with  DTs (delirium tremens) (HCC) Monitor on CIWA. Receiving   thiamine, folate and multivitamin. No signs of DTs at present. counseled strongly on cessation. SW consult.    Thrombocytopenia (HCC) Possibly alcohol-induced marrow suppression. Monitor     Alcohol withdrawal seizure without complication (HCC)  Seizure precautions. Monitor on CIWA. EEG negative for epilepsy.  No anticonvulsants needed.  Tobacco use nicotine patch. Counseled on cessation.   Severe hypokalemia/hypomagnesemia Replenished. Check phosphorous.   transaminitis Possibly alcohol induced hepatitis. Improving.  Code Status : full Code  Family Communication  : none at bedside  Disposition Plan  : transfer to telemetry  Barriers For Discharge : improving symptoms  Consults  :   Stroke team Vascular sx  Procedures  :  Head CT MRI brain 2-D echo   CT angiogram head and neck  DVT Prophylaxis  :  SCDs   Lab Results  Component Value Date   PLT 76 (L) 12/29/2016    Antibiotics  :    Anti-infectives    None        Objective:   Vitals:   12/28/16 2304 12/29/16 0348 12/29/16 0731 12/29/16 1109  BP: (!) 141/81 (!) 171/99 (!) 159/85 138/78  Pulse: 89 86 96 96  Resp: 15 15 16 19   Temp: 98 F (36.7 C) 98.1 F (36.7 C) 97.5 F (36.4 C) 99 F (37.2 C)  TempSrc: Oral Oral Oral Oral  SpO2: 94% 99% 98% 100%  Weight:      Height:        Wt Readings from Last 3 Encounters:  12/28/16 62.7 kg (138 lb 3.2 oz)     Intake/Output Summary (Last 24 hours) at 12/29/16 1241 Last data filed at 12/29/16 1109  Gross per 24 hour  Intake           2377.5 ml  Output             1400 ml  Net            977.5 ml     Physical Exam  ZOX:WRUEA and alert HEENT: , moist mucosa, supple neck Chest: clear b/l, no added sounds CVS: N S1&S2, no murmurs, GI: soft, NT, ND,  Musculoskeletal: warm, no edema CNS: alert and oriented, no focal weakness, tremors+    Data Review:    CBC  Recent Labs Lab 12/27/16 1900 12/29/16 0418  WBC 8.8 5.8  HGB 13.5  11.4*  HCT 40.5 34.3*  PLT 84* 76*  MCV 101.5* 100.3*  MCH 33.8 33.3  MCHC 33.3 33.2  RDW 15.7* 15.9*  LYMPHSABS 1.1  --   MONOABS 0.9  --   EOSABS 0.1  --   BASOSABS 0.0  --     Chemistries   Recent Labs Lab 12/27/16 1900 12/28/16 0139 12/28/16 0154 12/28/16 1234 12/29/16 0418 12/29/16 0923  NA 132*  --  134* 134* 135  --   K 2.9*  --  2.8* 3.0* 2.9*  --   CL 97*  --  100* 104 108  --   CO2 18*  --  17* 18* 21*  --   GLUCOSE 82  --  75 70 96  --   BUN 13  --  15 10 <5*  --   CREATININE 1.83*  --  1.32* 0.85 0.55*  --   CALCIUM 9.0  --  8.0* 8.2* 7.9*  --   MG  --  1.6*  --   --   --  2.2  AST 198*  --   --   --  123*  --   ALT 45  --   --   --  38  --   ALKPHOS 197*  --   --   --  112  --   BILITOT 1.8*  --   --   --  1.1  --    ------------------------------------------------------------------------------------------------------------------  Recent Labs  12/28/16 0124  CHOL 153  HDL 28*  LDLCALC 87  TRIG 540*  CHOLHDL 5.5  Lab Results  Component Value Date   HGBA1C 5.3 12/28/2016   ------------------------------------------------------------------------------------------------------------------ No results for input(s): TSH, T4TOTAL, T3FREE, THYROIDAB in the last 72 hours.  Invalid input(s): FREET3 ------------------------------------------------------------------------------------------------------------------ No results for input(s): VITAMINB12, FOLATE, FERRITIN, TIBC, IRON, RETICCTPCT in the last 72 hours.  Coagulation profile  Recent Labs Lab 12/27/16 1900  INR 1.10    No results for input(s): DDIMER in the last 72 hours.  Cardiac Enzymes No results for input(s): CKMB, TROPONINI, MYOGLOBIN in the last 168 hours.  Invalid input(s): CK ------------------------------------------------------------------------------------------------------------------ No results found for: BNP  Inpatient Medications  Scheduled Meds: .  stroke: mapping  our early stages of recovery book   Does not apply Once  . aspirin EC  325 mg Oral Daily   Or  . aspirin  300 mg Rectal Daily  . atorvastatin  40 mg Oral q1800  . feeding supplement (ENSURE ENLIVE)  237 mL Oral BID BM  . folic acid  1 mg Oral Daily  . [START ON 12/30/2016] Influenza vac split quadrivalent PF  0.5 mL Intramuscular Tomorrow-1000  . multivitamin with minerals  1 tablet Oral Daily  . [START ON 12/30/2016] pneumococcal 23 valent vaccine  0.5 mL Intramuscular Tomorrow-1000  . potassium chloride (KCL MULTIRUN) 30 mEq in 265 mL IVPB  30 mEq Intravenous Q8H  . thiamine  100 mg Oral Daily   Continuous Infusions: . sodium chloride 75 mL/hr at 12/29/16 0817   PRN Meds:.acetaminophen **OR** acetaminophen (TYLENOL) oral liquid 160 mg/5 mL **OR** acetaminophen, LORazepam  Micro Results Recent Results (from the past 240 hour(s))  Culture, blood (Routine x 2)     Status: None (Preliminary result)   Collection Time: 12/27/16  8:36 PM  Result Value Ref Range Status   Specimen Description BLOOD RIGHT ANTECUBITAL  Final   Special Requests IN PEDIATRIC BOTTLE 2CC  Final   Culture NO GROWTH < 24 HOURS  Final   Report Status PENDING  Incomplete  Culture, blood (Routine x 2)     Status: None (Preliminary result)   Collection Time: 12/27/16  8:36 PM  Result Value Ref Range Status   Specimen Description BLOOD RIGHT HAND  Final   Special Requests BOTTLES DRAWN AEROBIC AND ANAEROBIC 5CC  Final   Culture NO GROWTH < 24 HOURS  Final   Report Status PENDING  Incomplete  Urine culture     Status: Abnormal   Collection Time: 12/27/16  8:40 PM  Result Value Ref Range Status   Specimen Description URINE, RANDOM  Final   Special Requests NONE  Final   Culture MULTIPLE SPECIES PRESENT, SUGGEST RECOLLECTION (A)  Final   Report Status 12/29/2016 FINAL  Final  MRSA PCR Screening     Status: None   Collection Time: 12/28/16  3:02 AM  Result Value Ref Range Status   MRSA by PCR NEGATIVE NEGATIVE  Final    Comment:        The GeneXpert MRSA Assay (FDA approved for NASAL specimens only), is one component of a comprehensive MRSA colonization surveillance program. It is not intended to diagnose MRSA infection nor to guide or monitor treatment for MRSA infections.     Radiology Reports Ct Angio Head W Or Wo Contrast  Result Date: 12/28/2016 CLINICAL DATA:  61 y/o M; weakness history of ETOH abuse currently in delirium tremens. EXAM: CT ANGIOGRAPHY HEAD AND NECK TECHNIQUE: Multidetector CT imaging of the head and neck was performed using the standard protocol during bolus administration of intravenous contrast. Multiplanar CT image  reconstructions and MIPs were obtained to evaluate the vascular anatomy. Carotid stenosis measurements (when applicable) are obtained utilizing NASCET criteria, using the distal internal carotid diameter as the denominator. CONTRAST:  50 cc Isovue 370 COMPARISON:  12/27/2016 MRI and MRA of the head. FINDINGS: CT HEAD FINDINGS Brain: Area of hypoattenuation within the right anterior basal ganglia involving caudate head and lentiform nucleus corresponding to diffusion signal abnormality on MRI consistent with evolving acute infarction. No acute hemorrhage or new area of infarction. No significant mass effect. No extra-axial collection or hydrocephalus. Stable chronic lacunar infarct within left caudate head, mild volume loss of the brain, and mild chronic microvascular ischemic changes. Vascular: As below. Skull: Normal. Negative for fracture or focal lesion. Sinuses: Imaged portions are clear. Orbits: No acute finding. Review of the MIP images confirms the above findings CTA NECK FINDINGS Aortic arch: Mild aortic atherosclerosis with arch calcification. Four vessel arch. Right carotid system: Dense calcified plaque of right carotid bifurcation with severe 90% stenosis of proximal right ICA. Areas of fibrofatty plaque of right common carotid artery without significant  stenosis. Left carotid system: Patent left common carotid artery. Occlusion of left internal carotid artery from the bifurcation to the carotid terminus which is patent. Vertebral arteries: Codominant. No evidence of dissection, stenosis (50% or greater) or occlusion. Skeleton: Mild cervical spondylosis greatest at the C6-7 level. No high-grade bony canal stenosis or foraminal narrowing. Other neck: Negative appear Upper chest: 4 mm right upper lobe and right lower lobe pulmonary nodules (series 601, image 38 and 5). Mild centrilobular emphysematous changes with a large right upper lobe bulla anterior to mediastinum. Review of the MIP images confirms the above findings CTA HEAD FINDINGS Moderate motion artifact. Suboptimal evaluation for subtle stenosis or small aneurysm. Anterior circulation: Calcific atherosclerosis of right cavernous and supraclinoid segments without high-grade stenosis. Occluded left internal carotid artery to the level of the terminus which is patent. Bilateral middle cerebral arteries, anterior cerebral arteries, and branches are patent. Otherwise no high-grade stenosis, proximal occlusion, aneurysm, or vascular malformation identified. Posterior circulation: No high-grade stenosis, proximal occlusion, aneurysm, or vascular malformation. Venous sinuses: As permitted by contrast timing, patent. Anatomic variants: Patent anterior communicating and bilateral posterior communicating arteries. Delayed phase: No abnormal intracranial enhancement. Review of the MIP images confirms the above findings IMPRESSION: 1. Right anterior basal ganglia acute infarction stable in size in comparison with prior MRI. No hemorrhage identified. 2. 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 This may represent sequelae of age indeterminate dissection or atherosclerotic disease. 3. Proximal right ICA severe 90% stenosis secondary to dense calcified  plaque. 4. Patent vertebral arteries in the neck without high-grade stenosis. 5. Motion degraded CTA of the head. No high-grade stenosis, proximal occlusion, aneurysm, or vascular malformation of the anterior cerebral arteries, middle cerebral arteries, or posterior circulation identified. 6. 4 mm pulmonary nodules in the right upper and right lower lobe. No follow-up needed if patient is low-risk (and has no known or suspected primary neoplasm). Non-contrast chest CT can be considered in 12 months if patient is high-risk. This recommendation follows the consensus statement: Guidelines for Management of Incidental Pulmonary Nodules Detected on CT Images: From the Fleischner Society 2017; Radiology 2017; 284:228-243. 7. Mild centrilobular emphysema. Electronically Signed   By: Mitzi Hansen M.D.   On: 12/28/2016 17:06   Dg Chest 2 View  Result Date: 12/27/2016 CLINICAL DATA:  Weakness and weight loss. EXAM: CHEST  2 VIEW COMPARISON:  Report  from 08/09/2000 FINDINGS: The heart size and mediastinal contours are within normal limits. The lungs are mildly hyperinflated without pneumonic consolidation, CHF nor effusion. No pneumothorax. Bilateral nipple shadows are seen over the lower lobes. The visualized skeletal structures are unremarkable. IMPRESSION: No active cardiopulmonary disease. Mild hyperinflation of the lungs. Electronically Signed   By: Tollie Eth M.D.   On: 12/27/2016 19:27   Dg Abd 1 View  Result Date: 12/27/2016 CLINICAL DATA:  Pre MRI for foreign body in the intestine. EXAM: ABDOMEN - 1 VIEW COMPARISON:  None. FINDINGS: The bowel gas pattern is normal. No radio-opaque calculi nor foreign body identified. Osteophytes are seen along the dorsal spine. No acute osseous abnormality. No free air. IMPRESSION: No metallic foreign body in the abdomen or pelvis. Unremarkable bowel gas pattern. Electronically Signed   By: Tollie Eth M.D.   On: 12/27/2016 23:43   Ct Head Wo Contrast  Result  Date: 12/27/2016 CLINICAL DATA:  Initial evaluation for acute weakness. EXAM: CT HEAD WITHOUT CONTRAST TECHNIQUE: Contiguous axial images were obtained from the base of the skull through the vertex without intravenous contrast. COMPARISON:  Prior CT from 05/25/2005. FINDINGS: Brain: Diffuse prominence of the CSF containing spaces is compatible with generalized cerebral atrophy. Patchy hypodensity within the periventricular and deep white matter of both cerebral hemispheres is most consistent with chronic microvascular ischemic disease. Small remote lacunar infarct within the left basal ganglia. There is evolving hypodensity involving the right basal ganglia, with involvement of the right caudate and anterior lenticular nucleus, compatible with acute ischemic infarct. No associated mass effect or hemorrhage. No other acute intracranial hemorrhage or infarct identified. No mass lesion, midline shift, or mass effect. No hydrocephalus. No extra-axial fluid collection. Vascular: No hyperdense vessel. Scattered vascular calcifications noted within the carotid siphons. Skull: Focal hyperdensity noted at the right frontal scalp, of doubtful clinical significance. Scalp soft tissues demonstrate no acute abnormality. Calvarium intact. Sinuses/Orbits: Globes and orbital soft tissues within normal limits. Paranasal sinuses and mastoid air cells are clear. IMPRESSION: 1. Evolving acute/early subacute ischemic right basal ganglia infarct. 2. Small remote lacunar infarct within the left basal ganglia. 3. Age-related cerebral atrophy with chronic microvascular ischemic disease. Electronically Signed   By: Rise Mu M.D.   On: 12/27/2016 21:51   Ct Angio Neck W Or Wo Contrast  Result Date: 12/28/2016 CLINICAL DATA:  61 y/o M; weakness history of ETOH abuse currently in delirium tremens. EXAM: CT ANGIOGRAPHY HEAD AND NECK TECHNIQUE: Multidetector CT imaging of the head and neck was performed using the standard protocol  during bolus administration of intravenous contrast. Multiplanar CT image reconstructions and MIPs were obtained to evaluate the vascular anatomy. Carotid stenosis measurements (when applicable) are obtained utilizing NASCET criteria, using the distal internal carotid diameter as the denominator. CONTRAST:  50 cc Isovue 370 COMPARISON:  12/27/2016 MRI and MRA of the head. FINDINGS: CT HEAD FINDINGS Brain: Area of hypoattenuation within the right anterior basal ganglia involving caudate head and lentiform nucleus corresponding to diffusion signal abnormality on MRI consistent with evolving acute infarction. No acute hemorrhage or new area of infarction. No significant mass effect. No extra-axial collection or hydrocephalus. Stable chronic lacunar infarct within left caudate head, mild volume loss of the brain, and mild chronic microvascular ischemic changes. Vascular: As below. Skull: Normal. Negative for fracture or focal lesion. Sinuses: Imaged portions are clear. Orbits: No acute finding. Review of the MIP images confirms the above findings CTA NECK FINDINGS Aortic arch: Mild aortic atherosclerosis with arch  calcification. Four vessel arch. Right carotid system: Dense calcified plaque of right carotid bifurcation with severe 90% stenosis of proximal right ICA. Areas of fibrofatty plaque of right common carotid artery without significant stenosis. Left carotid system: Patent left common carotid artery. Occlusion of left internal carotid artery from the bifurcation to the carotid terminus which is patent. Vertebral arteries: Codominant. No evidence of dissection, stenosis (50% or greater) or occlusion. Skeleton: Mild cervical spondylosis greatest at the C6-7 level. No high-grade bony canal stenosis or foraminal narrowing. Other neck: Negative appear Upper chest: 4 mm right upper lobe and right lower lobe pulmonary nodules (series 601, image 38 and 5). Mild centrilobular emphysematous changes with a large right upper  lobe bulla anterior to mediastinum. Review of the MIP images confirms the above findings CTA HEAD FINDINGS Moderate motion artifact. Suboptimal evaluation for subtle stenosis or small aneurysm. Anterior circulation: Calcific atherosclerosis of right cavernous and supraclinoid segments without high-grade stenosis. Occluded left internal carotid artery to the level of the terminus which is patent. Bilateral middle cerebral arteries, anterior cerebral arteries, and branches are patent. Otherwise no high-grade stenosis, proximal occlusion, aneurysm, or vascular malformation identified. Posterior circulation: No high-grade stenosis, proximal occlusion, aneurysm, or vascular malformation. Venous sinuses: As permitted by contrast timing, patent. Anatomic variants: Patent anterior communicating and bilateral posterior communicating arteries. Delayed phase: No abnormal intracranial enhancement. Review of the MIP images confirms the above findings IMPRESSION: 1. Right anterior basal ganglia acute infarction stable in size in comparison with prior MRI. No hemorrhage identified. 2. 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 This may represent sequelae of age indeterminate dissection or atherosclerotic disease. 3. Proximal right ICA severe 90% stenosis secondary to dense calcified plaque. 4. Patent vertebral arteries in the neck without high-grade stenosis. 5. Motion degraded CTA of the head. No high-grade stenosis, proximal occlusion, aneurysm, or vascular malformation of the anterior cerebral arteries, middle cerebral arteries, or posterior circulation identified. 6. 4 mm pulmonary nodules in the right upper and right lower lobe. No follow-up needed if patient is low-risk (and has no known or suspected primary neoplasm). Non-contrast chest CT can be considered in 12 months if patient is high-risk. This recommendation follows the consensus statement: Guidelines  for Management of Incidental Pulmonary Nodules Detected on CT Images: From the Fleischner Society 2017; Radiology 2017; 284:228-243. 7. Mild centrilobular emphysema. Electronically Signed   By: Mitzi Hansen M.D.   On: 12/28/2016 17:06   Mr Maxine Glenn Head Wo Contrast  Result Date: 12/28/2016 CLINICAL DATA:  Initial evaluation for acute stroke. EXAM: MRI HEAD WITHOUT CONTRAST MRA HEAD WITHOUT CONTRAST TECHNIQUE: Multiplanar, multiecho pulse sequences of the brain and surrounding structures were obtained without intravenous contrast. Angiographic images of the head were obtained using MRA technique without contrast. COMPARISON:  Prior CT from earlier the same day. FINDINGS: MRI HEAD FINDINGS Brain: Study degraded by motion artifact. Mild diffuse prominence of the CSF containing spaces is compatible with generalized age-related cerebral atrophy. Patchy T2/FLAIR hyperintensity within the periventricular and deep white matter both cerebral hemispheres most compatible chronic microvascular ischemic disease, mild to moderate in nature. Abnormal restricted diffusion involves the right basal ganglia, specifically involving the anterior right lentiform nucleus and right caudate head. Involvement of the anterior limb of the right internal capsule. Associated T2/FLAIR signal abnormality without significant mass effect. This is largely acute in appearance on this exam. No associated hemorrhage. No other evidence for acute ischemia. No mass lesion, midline shift or mass effect.  No hydrocephalus. No extra-axial fluid collection. Major dural sinuses are grossly patent. No intrinsic temporal lobe abnormality. Pituitary gland and suprasellar region within normal limits. Vascular: Abnormal flow void within the left ICA to the terminus. Major intracranial vascular flow voids otherwise maintained. Skull and upper cervical spine: Craniocervical junction normal. Visualized upper cervical spine unremarkable. Bone marrow signal  intensity within normal limits. No scalp soft tissue abnormality. Sinuses/Orbits: Globes and orbital soft tissues within normal limits. Scattered mucosal thickening within the ethmoidal air cells and maxillary sinuses. No air-fluid level to suggest active sinus infection. No mastoid effusion. Inner ear structures grossly normal. MRA HEAD FINDINGS ANTERIOR CIRCULATION: Study degraded by motion artifact. Left ICA is occluded to the level of the terminus. Distal cervical right ICA widely patent with antegrade flow. Petrous, cavernous, and supraclinoid right ICA patent without high-grade stenosis. Right A1 segment dominant and widely patent. There is a patent hypoplastic left A1 segment. Anterior communicating artery normal. Anterior cerebral arteries patent to their distal aspects with suspected multifocal atheromatous irregularity. Left A1 segment patent. There is an apparent short-segment severe proximal left M2 stenosis (series 13, image 37). Small vessel atheromatous irregularity distally within the left MCA branches. Short-segment moderate stenosis within the distal right M1 segment (series 13, image 71). Distal small vessel atheromatous irregularity within the right MCA branches. POSTERIOR CIRCULATION: Vertebral arteries code dominant and patent to the vertebrobasilar junction. Posterior inferior cerebral arteries not well evaluated on this motion degraded study. Basilar artery mildly tortuous but widely patent to its distal aspect. Superior cerebral arteries patent bilaterally. Posterior cerebral arteries largely supplied via the basilar artery and are widely patent to their distal aspects. Small bilateral posterior communicating arteries noted. No aneurysm or vascular malformation. IMPRESSION: MRI HEAD IMPRESSION: 1. 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 No associated hemorrhage or significant mass effect. 2. 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 MRA HEAD IMPRESSION: 1.  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 2. Widely patent right carotid artery system. 3. 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 4. Widely patent vertebrobasilar system. Electronically Signed   By: Rise MuBenjamin  McClintock M.D.   On: 12/28/2016 01:30   Mr Brain Wo Contrast (neuro Protocol)  Result Date: 12/28/2016 CLINICAL DATA:  Initial evaluation for acute stroke. EXAM: MRI HEAD WITHOUT CONTRAST MRA HEAD WITHOUT CONTRAST TECHNIQUE: Multiplanar, multiecho pulse sequences of the brain and surrounding structures were obtained without intravenous contrast. Angiographic images of the head were obtained using MRA technique without contrast. COMPARISON:  Prior CT from earlier the same day. FINDINGS: MRI HEAD FINDINGS Brain: Study degraded by motion artifact. Mild diffuse prominence of the CSF containing spaces is compatible with generalized age-related cerebral atrophy. Patchy T2/FLAIR hyperintensity within the periventricular and deep white matter both cerebral hemispheres most compatible chronic microvascular ischemic disease, mild to moderate in nature. Abnormal restricted diffusion involves the right basal ganglia, specifically involving the anterior right lentiform nucleus and right caudate head. Involvement of the anterior limb of the right internal capsule. Associated T2/FLAIR signal abnormality without significant mass effect. This is largely acute in appearance on this exam. No associated hemorrhage. No other evidence for acute ischemia. No mass lesion, midline shift or mass effect. No hydrocephalus. No extra-axial fluid collection. Major dural sinuses are grossly patent. No intrinsic temporal lobe abnormality. Pituitary gland and suprasellar region within normal limits. Vascular: Abnormal flow void within the left ICA to the terminus. Major intracranial vascular flow voids otherwise maintained. Skull and upper  cervical spine: Craniocervical junction  normal. Visualized upper cervical spine unremarkable. Bone marrow signal intensity within normal limits. No scalp soft tissue abnormality. Sinuses/Orbits: Globes and orbital soft tissues within normal limits. Scattered mucosal thickening within the ethmoidal air cells and maxillary sinuses. No air-fluid level to suggest active sinus infection. No mastoid effusion. Inner ear structures grossly normal. MRA HEAD FINDINGS ANTERIOR CIRCULATION: Study degraded by motion artifact. Left ICA is occluded to the level of the terminus. Distal cervical right ICA widely patent with antegrade flow. Petrous, cavernous, and supraclinoid right ICA patent without high-grade stenosis. Right A1 segment dominant and widely patent. There is a patent hypoplastic left A1 segment. Anterior communicating artery normal. Anterior cerebral arteries patent to their distal aspects with suspected multifocal atheromatous irregularity. Left A1 segment patent. There is an apparent short-segment severe proximal left M2 stenosis (series 13, image 37). Small vessel atheromatous irregularity distally within the left MCA branches. Short-segment moderate stenosis within the distal right M1 segment (series 13, image 71). Distal small vessel atheromatous irregularity within the right MCA branches. POSTERIOR CIRCULATION: Vertebral arteries code dominant and patent to the vertebrobasilar junction. Posterior inferior cerebral arteries not well evaluated on this motion degraded study. Basilar artery mildly tortuous but widely patent to its distal aspect. Superior cerebral arteries patent bilaterally. Posterior cerebral arteries largely supplied via the basilar artery and are widely patent to their distal aspects. Small bilateral posterior communicating arteries noted. No aneurysm or vascular malformation. IMPRESSION: MRI HEAD IMPRESSION: 1. 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 No associated hemorrhage or significant mass effect. 2. Mild to  moderate chronic microvascular ischemic disease. MRA HEAD IMPRESSION: 1. 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 2. Widely patent right carotid artery system. 3. 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 4. Widely patent vertebrobasilar system. Electronically Signed   By: Rise Mu M.D.   On: 12/28/2016 01:30    Time Spent in minutes  35   Eddie North M.D on 12/29/2016 at 12:41 PM  Between 7am to 7pm - Pager - [phone]  After 7pm go to [email or web address] - password [facility]  [facility] -  Office  [phone]

## 2016-12-29 NOTE — Consult Note (Signed)
Referring Physician: Gonzella Lexhungel, MD   Patient name: Ian Thornton MRN: [id number] DOB: [birth year] Sex: male  REASON FOR CONSULT: right carotid stenosis with stroke  HPI: Ian Thornton is a 61 y.o. male admitted with confusion found to be in alcohol withdrawal but also basal ganglia stroke right side.  His delirium is slowly improving.  He does not really have any localizing symptoms currently.  He does have hypertension.  He has been started on ASA and statin.  He has thrombocytopenia thought to be due to his chronic alcohol use.  He works as an Personnel officerelectrician.  He states he drinks about 40 oz of beer 4 days per week.  He states he drinks less than he used to drink.  Primary other risk factor is tobacco abuse.  He denies prior stroke.  Past Medical History:  Diagnosis Date  . ETOH abuse    History reviewed. No pertinent surgical history.  Family History  Problem Relation Age of Onset  . Family history unknown: Yes    SOCIAL HISTORY: Social History   Social History  . Marital status: Married    Spouse name: N/A  . Number of children: N/A  . Years of education: N/A   Occupational History  . Not on file.   Social History Main Topics  . Smoking status: Current Every Day Smoker    Packs/day: 0.50  . Smokeless tobacco: Never Used  . Alcohol use 9.6 oz/week    16 Shots of liquor per week     Comment: 24 oz beer per day  . Drug use: No     Comment: previously used cocaine  . Sexual activity: Not on file   Other Topics Concern  . Not on file   Social History Narrative  . No narrative on file    No Known Allergies  Current Facility-Administered Medications  Medication Dose Route Frequency Provider Last Rate Last Dose  .  stroke: mapping our early stages of recovery book   Does not apply Once Hillary BowJared M Gardner, DO      . acetaminophen (TYLENOL) tablet 650 mg  650 mg Oral Q4H PRN Hillary BowJared M Gardner, DO       Or  . acetaminophen (TYLENOL) solution 650 mg  650 mg Per Tube Q4H PRN  Hillary BowJared M Gardner, DO       Or  . acetaminophen (TYLENOL) suppository 650 mg  650 mg Rectal Q4H PRN Hillary BowJared M Gardner, DO      . aspirin EC tablet 325 mg  325 mg Oral Daily Marvel PlanJindong Xu, MD   325 mg at 12/29/16 1006   Or  . aspirin suppository 300 mg  300 mg Rectal Daily Marvel PlanJindong Xu, MD      . atorvastatin (LIPITOR) tablet 40 mg  40 mg Oral q1800 Marvel PlanJindong Xu, MD   40 mg at 12/28/16 1737  . feeding supplement (ENSURE ENLIVE) (ENSURE ENLIVE) liquid 237 mL  237 mL Oral BID BM Nishant Dhungel, MD   237 mL at 12/29/16 1008  . folic acid (FOLVITE) tablet 1 mg  1 mg Oral Daily Hillary BowJared M Gardner, DO   1 mg at 12/29/16 1006  . [START ON 12/30/2016] Influenza vac split quadrivalent PF (FLUARIX) injection 0.5 mL  0.5 mL Intramuscular Tomorrow-1000 Nishant Dhungel, MD      . LORazepam (ATIVAN) injection 2-3 mg  2-3 mg Intravenous Q1H PRN Alexis Hugelmeyer, DO   2 mg at 12/28/16 2106  . multivitamin with minerals tablet 1 tablet  1 tablet  Oral Daily Hillary Bow, DO   1 tablet at 12/29/16 1005  . nicotine (NICODERM CQ - dosed in mg/24 hours) patch 21 mg  21 mg Transdermal Daily Nishant Dhungel, MD      . Melene Muller ON 12/30/2016] pneumococcal 23 valent vaccine (PNU-IMMUNE) injection 0.5 mL  0.5 mL Intramuscular Tomorrow-1000 Nishant Dhungel, MD      . potassium chloride SA (K-DUR,KLOR-CON) CR tablet 40 mEq  40 mEq Oral Once Nishant Dhungel, MD      . thiamine (VITAMIN B-1) tablet 100 mg  100 mg Oral Daily Nishant Dhungel, MD   100 mg at 12/29/16 1005    ROS:   General:  No weight loss, Fever, chills  HEENT: No recent headaches, no nasal bleeding, no visual changes, no sore throat  Neurologic: No dizziness, blackouts, seizures. No recent symptoms of stroke or mini- stroke. No recent episodes of slurred speech, or temporary blindness.  Cardiac: No recent episodes of chest pain/pressure, no shortness of breath at rest.  No shortness of breath with exertion.  Denies history of atrial fibrillation or irregular  heartbeat  Vascular: No history of rest pain in feet.  No history of claudication.  No history of non-healing ulcer, No history of DVT   Pulmonary: No home oxygen, no productive cough, no hemoptysis,  No asthma or wheezing  Musculoskeletal:  [ ]  Arthritis, [ ]  Low back pain,  [ ]  Joint pain  Hematologic:No history of hypercoagulable state.  No history of easy bleeding.  No history of anemia  Gastrointestinal: No hematochezia or melena,  No gastroesophageal reflux, no trouble swallowing  Urinary: [ ]  chronic Kidney disease, [ ]  on HD - [ ]  MWF or [ ]  TTHS, [ ]  Burning with urination, [ ]  Frequent urination, [ ]  Difficulty urinating;   Skin: No rashes  Psychological: No history of anxiety,  No history of depression   Physical Examination  Vitals:   12/28/16 2304 12/29/16 0348 12/29/16 0731 12/29/16 1109  BP: (!) 141/81 (!) 171/99 (!) 159/85 138/78  Pulse: 89 86 96 96  Resp: 15 15 16 19   Temp: 98 F (36.7 C) 98.1 F (36.7 C) 97.5 F (36.4 C) 99 F (37.2 C)  TempSrc: Oral Oral Oral Oral  SpO2: 94% 99% 98% 100%  Weight:      Height:        Body mass index is 19.83 kg/m.  General:  Alert and oriented, no acute distress HEENT: Normal Neck: No JVD Pulmonary: Clear to auscultation bilaterally Cardiac: Regular Rate and Rhythm  Abdomen: Soft, non-tender, non-distended, no mass Skin: No rash Extremity Pulses:  2+ radial, brachial, femoral pulses bilaterally Musculoskeletal: No deformity or edema  Neurologic: Upper and lower extremity motor 5/5 and symmetric, no facial assymetry, no pronator drift  DATA:  CTA neck left ICA occlusion, verts patent, right ICA calcific stenosis, read as 90% but calcified lesion and CT tends to overestimate these would say closer to 60%  CT/MR brain, right basal ganglia stroke sub acute, left basal ganglia chronic stroke  ASSESSMENT:  Right ICA stenosis with contralateral occlusion.  Doubt embolic since this is deep brain but could be under  perfusion with contralateral occlusion.   PLAN:  1. Carotid duplex scan to further define flow characteristics  2. Continue ASA statin  3. Benefit with CEA as long as more than 50% in presence of stroke.  Contralateral occlusion is not associated with increase stroke with CEA and lesion is fairly calcified so would not consider carotid  stent in this patient with no other real risk factors.  In light of recent alcohol withdrawal/ multiple electrolyte derangements would allow him to recover a few weeks then proceed with Right CEA 01/12/17   Fabienne Bruns, MD [facility] Office: [phone] Pager: [phone]

## 2016-12-30 ENCOUNTER — Inpatient Hospital Stay (HOSPITAL_COMMUNITY): Payer: Self-pay

## 2016-12-30 DIAGNOSIS — I639 Cerebral infarction, unspecified: Secondary | ICD-10-CM

## 2016-12-30 LAB — VAS US CAROTID
LCCADSYS: -53 cm/s
LCCAPDIAS: 1 cm/s
LCCAPSYS: 80 cm/s
LEFT ECA DIAS: -18 cm/s
LEFT VERTEBRAL DIAS: 23 cm/s
LICAPSYS: 7 cm/s
Left CCA dist dias: -9 cm/s
RIGHT ECA DIAS: -18 cm/s
RIGHT VERTEBRAL DIAS: 23 cm/s
Right CCA prox dias: 9 cm/s
Right CCA prox sys: 74 cm/s
Right cca dist sys: -112 cm/s

## 2016-12-30 LAB — MAGNESIUM: Magnesium: 1.8 mg/dL (ref 1.7–2.4)

## 2016-12-30 LAB — CBC
HCT: 34.2 % — ABNORMAL LOW (ref 39.0–52.0)
HEMOGLOBIN: 11.2 g/dL — AB (ref 13.0–17.0)
MCH: 33.5 pg (ref 26.0–34.0)
MCHC: 32.7 g/dL (ref 30.0–36.0)
MCV: 102.4 fL — ABNORMAL HIGH (ref 78.0–100.0)
Platelets: 99 10*3/uL — ABNORMAL LOW (ref 150–400)
RBC: 3.34 MIL/uL — ABNORMAL LOW (ref 4.22–5.81)
RDW: 16.2 % — ABNORMAL HIGH (ref 11.5–15.5)
WBC: 7.7 10*3/uL (ref 4.0–10.5)

## 2016-12-30 LAB — COMPREHENSIVE METABOLIC PANEL
ALK PHOS: 113 U/L (ref 38–126)
ALT: 35 U/L (ref 17–63)
ANION GAP: 5 (ref 5–15)
AST: 80 U/L — ABNORMAL HIGH (ref 15–41)
Albumin: 2.9 g/dL — ABNORMAL LOW (ref 3.5–5.0)
BILIRUBIN TOTAL: 1.2 mg/dL (ref 0.3–1.2)
BUN: <5 mg/dL — ABNORMAL LOW (ref 6–20)
CO2: 24 mmol/L (ref 22–32)
Calcium: 8.5 mg/dL — ABNORMAL LOW (ref 8.9–10.3)
Chloride: 107 mmol/L (ref 101–111)
Creatinine, Ser: 0.61 mg/dL (ref 0.61–1.24)
GFR calc non Af Amer: >60 mL/min (ref >60)
Glucose, Bld: 104 mg/dL — ABNORMAL HIGH (ref 65–99)
Potassium: 3.8 mmol/L (ref 3.5–5.1)
SODIUM: 136 mmol/L (ref 135–145)
Total Protein: 6.7 g/dL (ref 6.5–8.1)

## 2016-12-30 MED ORDER — HYDRALAZINE HCL 20 MG/ML IJ SOLN
10.0000 mg | Freq: Once | INTRAMUSCULAR | Status: AC
  Administered 2016-12-30: 10 mg via INTRAVENOUS
  Filled 2016-12-30: qty 1

## 2016-12-30 MED ORDER — MAGNESIUM SULFATE 2 GM/50ML IV SOLN
2.0000 g | Freq: Once | INTRAVENOUS | Status: AC
  Administered 2016-12-30: 2 g via INTRAVENOUS
  Filled 2016-12-30: qty 50

## 2016-12-30 MED ORDER — SODIUM CHLORIDE 0.9 % IV SOLN
INTRAVENOUS | Status: DC
  Administered 2016-12-30: 10:00:00 via INTRAVENOUS

## 2016-12-30 MED ORDER — SODIUM CHLORIDE 0.9 % IV SOLN: INTRAVENOUS | Status: DC

## 2016-12-30 MED ORDER — POTASSIUM CHLORIDE CRYS ER 20 MEQ PO TBCR
40.0000 meq | EXTENDED_RELEASE_TABLET | Freq: Once | ORAL | Status: AC
  Administered 2016-12-30: 40 meq via ORAL
  Filled 2016-12-30: qty 2

## 2016-12-30 NOTE — Progress Notes (Signed)
Pt constantly getting out of bed. Bed exit alarm activated and audible.  Each time he try getting out of bed staff rushed in to his room.  He became aggravated and turned bed alarm off by himself saying he do no want it on.  He was reeducated on the need and the reason why it must be on.  He verbalized understanding.  Will continue to monitor.

## 2016-12-30 NOTE — Progress Notes (Signed)
*  PRELIMINARY RESULTS* Vascular Ultrasound Carotid Duplex (Doppler) has been completed.  Preliminary findings: Right 80-99% ICA stenosis. Left ICA occluded.  Antegrade vertebral flow.   Farrel DemarkJill Eunice, RDMS, RVT  12/30/2016, 10:50 AM

## 2016-12-30 NOTE — Progress Notes (Signed)
Initial Nutrition Assessment  DOCUMENTATION CODES:   Not applicable  INTERVENTION:   -Continue Ensure Enlive po BID, each supplement provides 350 kcal and 20 grams of protein  NUTRITION DIAGNOSIS:   Increased nutrient needs related to acute illness as evidenced by estimated needs.  GOAL:   Patient will meet greater than or equal to 90% of their needs  MONITOR:   PO intake, Supplement acceptance, Labs, Weight trends, Skin, I & O's  REASON FOR ASSESSMENT:   Malnutrition Screening Tool    ASSESSMENT:   Ian Thornton is a 61 y.o. male with medical history significant of EtOH abuse.  Patient is brought in to the ED by family members due to change in mental status with agitation, confusion, and visual hallucinations.  Unclear when patient last drank EtOH.  Had seizure 2-3 days ago.  No medical attention sought at that time.  Pt admitted with acute ischemic stroke to rt basal ganglia.   Spoke with pt and roommate at bedside. Both report a decline in appetite over the past 3 months. Pt shares that he generally consumes 1 meal per day consisting of "country food". Pt roommate shares that pt typically is not a large eater, however, intake started declining dramatically over the past several weeks, which she relates to the upcoming anniversary of the death of pt's mother.   Pt estimated he has lost about 15# over the past month, however, no wt hx available to confirm this finding. He shares that his clothing been fitting looser and friends and family members have made comments about difference in his appearance.   Pt reports appetite has returned since coming to the hospital. Noted 100% meal completion. Pt enjoys Ensure supplements and is amenable to continue them in hospital. Discussed importance of good nutritional intake (meal and supplements) to prevent further weight loss. Also discussed ways pt could include addition protein in his diet. Pt reports he would like to continue Ensure  supplements at home.   Nutrition-Focused physical exam completed. Findings are no fat depletion, mild muscle depletion, and no edema. Pt reports he has always had a thin, athletic frame.   Plan for SNF placement upon discharge.   Labs reviewed.  Diet Order:  Diet regular Room service appropriate? Yes; Fluid consistency: Thin  Skin:  Reviewed, no issues  Last BM:  12/28/16  Height:   Ht Readings from Last 1 Encounters:  12/28/16 5\' 10"  (1.778 m)    Weight:   Wt Readings from Last 1 Encounters:  12/28/16 138 lb 3.2 oz (62.7 kg)    Ideal Body Weight:  75.5 kg  BMI:  Body mass index is 19.83 kg/m.  Estimated Nutritional Needs:   Kcal:  1700-1900  Protein:  80-95 grams  Fluid:  1.7-1.9 L  EDUCATION NEEDS:   Education needs addressed  Pebbles Zeiders A. Mayford KnifeWilliams, RD, LDN, CDE Pager: [phone] After hours Pager: [phone]

## 2016-12-30 NOTE — Progress Notes (Addendum)
PROGRESS NOTE                                                                                                                                                                                                             Patient Demographics:    Ian Thornton, is a 61 y.o. male, DOB - [birth year], ZOX:[id number]  Admit date - 12/27/2016   Admitting Physician Hillary Bow, DO  Outpatient Primary MD for the patient is No primary care provider on file.  LOS - 3  Outpatient Specialists:None  Chief Complaint  Patient presents with  . Weakness       Brief Narrative  61 year old male with significant alcohol abuse heavy smoker who was brought to the ED by family with change in mental status for past few days with agitation and confusion. Family also noted visual hallucination. Patient apparently had stopped drinking last few days and reportedly also had seizures 2-3 days back. In the ED vitals were stable patient was confused, hallucinating and confabulating. Alcohol level was undetectable. Placed on CIWA. MRI of the brain done showing acute right basal ganglia infarct. Admitted to stepdown unit. Stroke team consulted.   Subjective:   Still has some tremors. Tachycardic with frequent PVCs on the monitor. Denies any hallucinations.  Assessment  & Plan :    Principal Problem:   CVA (cerebral vascular accident) (HCC) Acute right basal ganglia ischemic stroke. No residual weakness. May have some speech/attention deficiency which could be due to alcohol withdrawal. Stroke team evaluated. Started  on aspirin. Recommend against DAPT due to thrombocytopenia. LDL of 87 with high triglycerides.  A1c 5.3. PT recommends SNF. Needs outpatient speech therapy. Social work consulted. -2-D echo with normal EF and no wall motion abnormality, no cardiac source of emboli., CT angiogram of the head and neck shows 90% stenosis of rt ICA and occluded  left ICA.Marland Kitchen   Vascular surgery consulted who ordered carotid Doppler which preliminary shows right 80-99% ICA stenosis and occluded left ICA.  Recommend right CEA on 01/12/2017 once patient out of his current alcohol withdrawal and more stable.   Active Problems: Alcohol abuse/withdrawal with  DTs (delirium tremens) (HCC) Monitor on CIWA. Receiving  thiamine, folate and multivitamin. No signs of DTs at present but still has mild withdrawal. Counseled strongly on cessation. Social work consulted.    Thrombocytopenia (HCC)  Possibly alcohol-induced marrow suppression. Slowly improving.     Alcohol withdrawal seizure without complication (HCC)  Seizure precautions. Monitor on CIWA. EEG negative for epilepsy.  No anticonvulsants needed.  Tobacco use nicotine patch. Counseled on cessation.   Severe hypokalemia/hypomagnesemia Replenished.    transaminitis Possibly alcohol induced hepatitis. Improving.      Code Status : full Code  Family Communication  : none at bedside  Disposition Plan  : Possibly discharge to SNF tomorrow if improving  Barriers For Discharge : improving symptoms  Consults  :   Stroke team Vascular sx ( Dr. fields)  Procedures  :  Head CT MRI brain 2-D echo   CT angiogram head and  Carotid ultrasound   DVT Prophylaxis  :  SCDs   Lab Results  Component Value Date   PLT 99 (L) 12/30/2016    Antibiotics  :    Anti-infectives    None        Objective:   Vitals:   12/29/16 1109 12/29/16 1508 12/29/16 2114 12/30/16 0544  BP: 138/78 (!) 172/96 (!) 162/91 (!) 177/104  Pulse: 96 91 91 86  Resp: 19 20 20 18   Temp: 99 F (37.2 C) (!) 100.5 F (38.1 C) 99.1 F (37.3 C) 99.3 F (37.4 C)  TempSrc: Oral Oral  Oral  SpO2: 100% 100% 100% 100%  Weight:      Height:        Wt Readings from Last 3 Encounters:  12/28/16 62.7 kg (138 lb 3.2 oz)     Intake/Output Summary (Last 24 hours) at 12/30/16 1205 Last data filed at 12/30/16 1117   Gross per 24 hour  Intake              331 ml  Output              250 ml  Net               81 ml     Physical Exam  MWU:XLKGMGen:awake and alert HEENT: , moist mucosa, supple neck Chest: clear b/l, no added sounds CVS: N S1&S2, no murmurs, GI: soft, NT, ND,  Musculoskeletal: warm, no edema CNS: alert and oriented,On no focal weakness, tremors+    Data Review:    CBC  Recent Labs Lab 12/27/16 1900 12/29/16 0418 12/30/16 0557  WBC 8.8 5.8 7.7  HGB 13.5 11.4* 11.2*  HCT 40.5 34.3* 34.2*  PLT 84* 76* 99*  MCV 101.5* 100.3* 102.4*  MCH 33.8 33.3 33.5  MCHC 33.3 33.2 32.7  RDW 15.7* 15.9* 16.2*  LYMPHSABS 1.1  --   --   MONOABS 0.9  --   --   EOSABS 0.1  --   --   BASOSABS 0.0  --   --     Chemistries   Recent Labs Lab 12/27/16 1900 12/28/16 0139 12/28/16 0154 12/28/16 1234 12/29/16 0418 12/29/16 0923 12/30/16 0557 12/30/16 0916  NA 132*  --  134* 134* 135  --  136  --   K 2.9*  --  2.8* 3.0* 2.9*  --  3.8  --   CL 97*  --  100* 104 108  --  107  --   CO2 18*  --  17* 18* 21*  --  24  --   GLUCOSE 82  --  75 70 96  --  104*  --   BUN 13  --  15 10 <5*  --  <5*  --   CREATININE 1.83*  --  1.32* 0.85 0.55*  --  0.61  --   CALCIUM 9.0  --  8.0* 8.2* 7.9*  --  8.5*  --   MG  --  1.6*  --   --   --  2.2  --  1.8  AST 198*  --   --   --  123*  --  80*  --   ALT 45  --   --   --  38  --  35  --   ALKPHOS 197*  --   --   --  112  --  113  --   BILITOT 1.8*  --   --   --  1.1  --  1.2  --    ------------------------------------------------------------------------------------------------------------------  Recent Labs  12/28/16 0124  CHOL 153  HDL 28*  LDLCALC 87  TRIG 161*  CHOLHDL 5.5    Lab Results  Component Value Date   HGBA1C 5.3 12/28/2016   ------------------------------------------------------------------------------------------------------------------ No results for input(s): TSH, T4TOTAL, T3FREE, THYROIDAB in the last 72 hours.  Invalid  input(s): FREET3 ------------------------------------------------------------------------------------------------------------------ No results for input(s): VITAMINB12, FOLATE, FERRITIN, TIBC, IRON, RETICCTPCT in the last 72 hours.  Coagulation profile  Recent Labs Lab 12/27/16 1900  INR 1.10    No results for input(s): DDIMER in the last 72 hours.  Cardiac Enzymes No results for input(s): CKMB, TROPONINI, MYOGLOBIN in the last 168 hours.  Invalid input(s): CK ------------------------------------------------------------------------------------------------------------------ No results found for: BNP  Inpatient Medications  Scheduled Meds: .  stroke: mapping our early stages of recovery book   Does not apply Once  . aspirin EC  325 mg Oral Daily   Or  . aspirin  300 mg Rectal Daily  . atorvastatin  40 mg Oral q1800  . feeding supplement (ENSURE ENLIVE)  237 mL Oral BID BM  . folic acid  1 mg Oral Daily  . multivitamin with minerals  1 tablet Oral Daily  . nicotine  21 mg Transdermal Daily  . thiamine  100 mg Oral Daily   Continuous Infusions: . sodium chloride 100 mL/hr at 12/30/16 1017   PRN Meds:.acetaminophen **OR** acetaminophen (TYLENOL) oral liquid 160 mg/5 mL **OR** acetaminophen, LORazepam  Micro Results Recent Results (from the past 240 hour(s))  Culture, blood (Routine x 2)     Status: None (Preliminary result)   Collection Time: 12/27/16  8:36 PM  Result Value Ref Range Status   Specimen Description BLOOD RIGHT ANTECUBITAL  Final   Special Requests IN PEDIATRIC BOTTLE 2CC  Final   Culture NO GROWTH 2 DAYS  Final   Report Status PENDING  Incomplete  Culture, blood (Routine x 2)     Status: None (Preliminary result)   Collection Time: 12/27/16  8:36 PM  Result Value Ref Range Status   Specimen Description BLOOD RIGHT HAND  Final   Special Requests BOTTLES DRAWN AEROBIC AND ANAEROBIC 5CC  Final   Culture NO GROWTH 2 DAYS  Final   Report Status PENDING   Incomplete  Urine culture     Status: Abnormal   Collection Time: 12/27/16  8:40 PM  Result Value Ref Range Status   Specimen Description URINE, RANDOM  Final   Special Requests NONE  Final   Culture MULTIPLE SPECIES PRESENT, SUGGEST RECOLLECTION (A)  Final   Report Status 12/29/2016 FINAL  Final  MRSA PCR Screening     Status: None   Collection Time: 12/28/16  3:02 AM  Result Value Ref Range Status   MRSA by PCR  NEGATIVE NEGATIVE Final    Comment:        The GeneXpert MRSA Assay (FDA approved for NASAL specimens only), is one component of a comprehensive MRSA colonization surveillance program. It is not intended to diagnose MRSA infection nor to guide or monitor treatment for MRSA infections.     Radiology Reports Ct Angio Head W Or Wo Contrast  Result Date: 12/28/2016 CLINICAL DATA:  61 y/o M; weakness history of ETOH abuse currently in delirium tremens. EXAM: CT ANGIOGRAPHY HEAD AND NECK TECHNIQUE: Multidetector CT imaging of the head and neck was performed using the standard protocol during bolus administration of intravenous contrast. Multiplanar CT image reconstructions and MIPs were obtained to evaluate the vascular anatomy. Carotid stenosis measurements (when applicable) are obtained utilizing NASCET criteria, using the distal internal carotid diameter as the denominator. CONTRAST:  50 cc Isovue 370 COMPARISON:  12/27/2016 MRI and MRA of the head. FINDINGS: CT HEAD FINDINGS Brain: Area of hypoattenuation within the right anterior basal ganglia involving caudate head and lentiform nucleus corresponding to diffusion signal abnormality on MRI consistent with evolving acute infarction. No acute hemorrhage or new area of infarction. No significant mass effect. No extra-axial collection or hydrocephalus. Stable chronic lacunar infarct within left caudate head, mild volume loss of the brain, and mild chronic microvascular ischemic changes. Vascular: As below. Skull: Normal. Negative for  fracture or focal lesion. Sinuses: Imaged portions are clear. Orbits: No acute finding. Review of the MIP images confirms the above findings CTA NECK FINDINGS Aortic arch: Mild aortic atherosclerosis with arch calcification. Four vessel arch. Right carotid system: Dense calcified plaque of right carotid bifurcation with severe 90% stenosis of proximal right ICA. Areas of fibrofatty plaque of right common carotid artery without significant stenosis. Left carotid system: Patent left common carotid artery. Occlusion of left internal carotid artery from the bifurcation to the carotid terminus which is patent. Vertebral arteries: Codominant. No evidence of dissection, stenosis (50% or greater) or occlusion. Skeleton: Mild cervical spondylosis greatest at the C6-7 level. No high-grade bony canal stenosis or foraminal narrowing. Other neck: Negative appear Upper chest: 4 mm right upper lobe and right lower lobe pulmonary nodules (series 601, image 38 and 5). Mild centrilobular emphysematous changes with a large right upper lobe bulla anterior to mediastinum. Review of the MIP images confirms the above findings CTA HEAD FINDINGS Moderate motion artifact. Suboptimal evaluation for subtle stenosis or small aneurysm. Anterior circulation: Calcific atherosclerosis of right cavernous and supraclinoid segments without high-grade stenosis. Occluded left internal carotid artery to the level of the terminus which is patent. Bilateral middle cerebral arteries, anterior cerebral arteries, and branches are patent. Otherwise no high-grade stenosis, proximal occlusion, aneurysm, or vascular malformation identified. Posterior circulation: No high-grade stenosis, proximal occlusion, aneurysm, or vascular malformation. Venous sinuses: As permitted by contrast timing, patent. Anatomic variants: Patent anterior communicating and bilateral posterior communicating arteries. Delayed phase: No abnormal intracranial enhancement. Review of the MIP  images confirms the above findings IMPRESSION: 1. Right anterior basal ganglia acute infarction stable in size in comparison with prior MRI. No hemorrhage identified. 2. 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 This may represent sequelae of age indeterminate dissection or atherosclerotic disease. 3. Proximal right ICA severe 90% stenosis secondary to dense calcified plaque. 4. Patent vertebral arteries in the neck without high-grade stenosis. 5. Motion degraded CTA of the head. No high-grade stenosis, proximal occlusion, aneurysm, or vascular malformation of the anterior cerebral arteries, middle cerebral arteries,  or posterior circulation identified. 6. 4 mm pulmonary nodules in the right upper and right lower lobe. No follow-up needed if patient is low-risk (and has no known or suspected primary neoplasm). Non-contrast chest CT can be considered in 12 months if patient is high-risk. This recommendation follows the consensus statement: Guidelines for Management of Incidental Pulmonary Nodules Detected on CT Images: From the Fleischner Society 2017; Radiology 2017; 284:228-243. 7. Mild centrilobular emphysema. Electronically Signed   By: Mitzi Hansen M.D.   On: 12/28/2016 17:06   Dg Chest 2 View  Result Date: 12/27/2016 CLINICAL DATA:  Weakness and weight loss. EXAM: CHEST  2 VIEW COMPARISON:  Report from 08/09/2000 FINDINGS: The heart size and mediastinal contours are within normal limits. The lungs are mildly hyperinflated without pneumonic consolidation, CHF nor effusion. No pneumothorax. Bilateral nipple shadows are seen over the lower lobes. The visualized skeletal structures are unremarkable. IMPRESSION: No active cardiopulmonary disease. Mild hyperinflation of the lungs. Electronically Signed   By: Tollie Eth M.D.   On: 12/27/2016 19:27   Dg Abd 1 View  Result Date: 12/27/2016 CLINICAL DATA:  Pre MRI for foreign body in  the intestine. EXAM: ABDOMEN - 1 VIEW COMPARISON:  None. FINDINGS: The bowel gas pattern is normal. No radio-opaque calculi nor foreign body identified. Osteophytes are seen along the dorsal spine. No acute osseous abnormality. No free air. IMPRESSION: No metallic foreign body in the abdomen or pelvis. Unremarkable bowel gas pattern. Electronically Signed   By: Tollie Eth M.D.   On: 12/27/2016 23:43   Ct Head Wo Contrast  Result Date: 12/27/2016 CLINICAL DATA:  Initial evaluation for acute weakness. EXAM: CT HEAD WITHOUT CONTRAST TECHNIQUE: Contiguous axial images were obtained from the base of the skull through the vertex without intravenous contrast. COMPARISON:  Prior CT from 05/25/2005. FINDINGS: Brain: Diffuse prominence of the CSF containing spaces is compatible with generalized cerebral atrophy. Patchy hypodensity within the periventricular and deep white matter of both cerebral hemispheres is most consistent with chronic microvascular ischemic disease. Small remote lacunar infarct within the left basal ganglia. There is evolving hypodensity involving the right basal ganglia, with involvement of the right caudate and anterior lenticular nucleus, compatible with acute ischemic infarct. No associated mass effect or hemorrhage. No other acute intracranial hemorrhage or infarct identified. No mass lesion, midline shift, or mass effect. No hydrocephalus. No extra-axial fluid collection. Vascular: No hyperdense vessel. Scattered vascular calcifications noted within the carotid siphons. Skull: Focal hyperdensity noted at the right frontal scalp, of doubtful clinical significance. Scalp soft tissues demonstrate no acute abnormality. Calvarium intact. Sinuses/Orbits: Globes and orbital soft tissues within normal limits. Paranasal sinuses and mastoid air cells are clear. IMPRESSION: 1. Evolving acute/early subacute ischemic right basal ganglia infarct. 2. Small remote lacunar infarct within the left basal ganglia.  3. Age-related cerebral atrophy with chronic microvascular ischemic disease. Electronically Signed   By: Rise Mu M.D.   On: 12/27/2016 21:51   Ct Angio Neck W Or Wo Contrast  Result Date: 12/28/2016 CLINICAL DATA:  61 y/o M; weakness history of ETOH abuse currently in delirium tremens. EXAM: CT ANGIOGRAPHY HEAD AND NECK TECHNIQUE: Multidetector CT imaging of the head and neck was performed using the standard protocol during bolus administration of intravenous contrast. Multiplanar CT image reconstructions and MIPs were obtained to evaluate the vascular anatomy. Carotid stenosis measurements (when applicable) are obtained utilizing NASCET criteria, using the distal internal carotid diameter as the denominator. CONTRAST:  50 cc Isovue 370 COMPARISON:  12/27/2016 MRI  and MRA of the head. FINDINGS: CT HEAD FINDINGS Brain: Area of hypoattenuation within the right anterior basal ganglia involving caudate head and lentiform nucleus corresponding to diffusion signal abnormality on MRI consistent with evolving acute infarction. No acute hemorrhage or new area of infarction. No significant mass effect. No extra-axial collection or hydrocephalus. Stable chronic lacunar infarct within left caudate head, mild volume loss of the brain, and mild chronic microvascular ischemic changes. Vascular: As below. Skull: Normal. Negative for fracture or focal lesion. Sinuses: Imaged portions are clear. Orbits: No acute finding. Review of the MIP images confirms the above findings CTA NECK FINDINGS Aortic arch: Mild aortic atherosclerosis with arch calcification. Four vessel arch. Right carotid system: Dense calcified plaque of right carotid bifurcation with severe 90% stenosis of proximal right ICA. Areas of fibrofatty plaque of right common carotid artery without significant stenosis. Left carotid system: Patent left common carotid artery. Occlusion of left internal carotid artery from the bifurcation to the carotid  terminus which is patent. Vertebral arteries: Codominant. No evidence of dissection, stenosis (50% or greater) or occlusion. Skeleton: Mild cervical spondylosis greatest at the C6-7 level. No high-grade bony canal stenosis or foraminal narrowing. Other neck: Negative appear Upper chest: 4 mm right upper lobe and right lower lobe pulmonary nodules (series 601, image 38 and 5). Mild centrilobular emphysematous changes with a large right upper lobe bulla anterior to mediastinum. Review of the MIP images confirms the above findings CTA HEAD FINDINGS Moderate motion artifact. Suboptimal evaluation for subtle stenosis or small aneurysm. Anterior circulation: Calcific atherosclerosis of right cavernous and supraclinoid segments without high-grade stenosis. Occluded left internal carotid artery to the level of the terminus which is patent. Bilateral middle cerebral arteries, anterior cerebral arteries, and branches are patent. Otherwise no high-grade stenosis, proximal occlusion, aneurysm, or vascular malformation identified. Posterior circulation: No high-grade stenosis, proximal occlusion, aneurysm, or vascular malformation. Venous sinuses: As permitted by contrast timing, patent. Anatomic variants: Patent anterior communicating and bilateral posterior communicating arteries. Delayed phase: No abnormal intracranial enhancement. Review of the MIP images confirms the above findings IMPRESSION: 1. Right anterior basal ganglia acute infarction stable in size in comparison with prior MRI. No hemorrhage identified. 2. 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 This may represent sequelae of age indeterminate dissection or atherosclerotic disease. 3. Proximal right ICA severe 90% stenosis secondary to dense calcified plaque. 4. Patent vertebral arteries in the neck without high-grade stenosis. 5. Motion degraded CTA of the head. No high-grade stenosis, proximal  occlusion, aneurysm, or vascular malformation of the anterior cerebral arteries, middle cerebral arteries, or posterior circulation identified. 6. 4 mm pulmonary nodules in the right upper and right lower lobe. No follow-up needed if patient is low-risk (and has no known or suspected primary neoplasm). Non-contrast chest CT can be considered in 12 months if patient is high-risk. This recommendation follows the consensus statement: Guidelines for Management of Incidental Pulmonary Nodules Detected on CT Images: From the Fleischner Society 2017; Radiology 2017; 284:228-243. 7. Mild centrilobular emphysema. Electronically Signed   By: Mitzi Hansen M.D.   On: 12/28/2016 17:06   Mr Maxine Glenn Head Wo Contrast  Result Date: 12/28/2016 CLINICAL DATA:  Initial evaluation for acute stroke. EXAM: MRI HEAD WITHOUT CONTRAST MRA HEAD WITHOUT CONTRAST TECHNIQUE: Multiplanar, multiecho pulse sequences of the brain and surrounding structures were obtained without intravenous contrast. Angiographic images of the head were obtained using MRA technique without contrast. COMPARISON:  Prior CT from earlier the same  day. FINDINGS: MRI HEAD FINDINGS Brain: Study degraded by motion artifact. Mild diffuse prominence of the CSF containing spaces is compatible with generalized age-related cerebral atrophy. Patchy T2/FLAIR hyperintensity within the periventricular and deep white matter both cerebral hemispheres most compatible chronic microvascular ischemic disease, mild to moderate in nature. Abnormal restricted diffusion involves the right basal ganglia, specifically involving the anterior right lentiform nucleus and right caudate head. Involvement of the anterior limb of the right internal capsule. Associated T2/FLAIR signal abnormality without significant mass effect. This is largely acute in appearance on this exam. No associated hemorrhage. No other evidence for acute ischemia. No mass lesion, midline shift or mass effect. No  hydrocephalus. No extra-axial fluid collection. Major dural sinuses are grossly patent. No intrinsic temporal lobe abnormality. Pituitary gland and suprasellar region within normal limits. Vascular: Abnormal flow void within the left ICA to the terminus. Major intracranial vascular flow voids otherwise maintained. Skull and upper cervical spine: Craniocervical junction normal. Visualized upper cervical spine unremarkable. Bone marrow signal intensity within normal limits. No scalp soft tissue abnormality. Sinuses/Orbits: Globes and orbital soft tissues within normal limits. Scattered mucosal thickening within the ethmoidal air cells and maxillary sinuses. No air-fluid level to suggest active sinus infection. No mastoid effusion. Inner ear structures grossly normal. MRA HEAD FINDINGS ANTERIOR CIRCULATION: Study degraded by motion artifact. Left ICA is occluded to the level of the terminus. Distal cervical right ICA widely patent with antegrade flow. Petrous, cavernous, and supraclinoid right ICA patent without high-grade stenosis. Right A1 segment dominant and widely patent. There is a patent hypoplastic left A1 segment. Anterior communicating artery normal. Anterior cerebral arteries patent to their distal aspects with suspected multifocal atheromatous irregularity. Left A1 segment patent. There is an apparent short-segment severe proximal left M2 stenosis (series 13, image 37). Small vessel atheromatous irregularity distally within the left MCA branches. Short-segment moderate stenosis within the distal right M1 segment (series 13, image 71). Distal small vessel atheromatous irregularity within the right MCA branches. POSTERIOR CIRCULATION: Vertebral arteries code dominant and patent to the vertebrobasilar junction. Posterior inferior cerebral arteries not well evaluated on this motion degraded study. Basilar artery mildly tortuous but widely patent to its distal aspect. Superior cerebral arteries patent  bilaterally. Posterior cerebral arteries largely supplied via the basilar artery and are widely patent to their distal aspects. Small bilateral posterior communicating arteries noted. No aneurysm or vascular malformation. IMPRESSION: MRI HEAD IMPRESSION: 1. 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 No associated hemorrhage or significant mass effect. 2. 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 MRA HEAD IMPRESSION: 1. 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 2. Widely patent right carotid artery system. 3. 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 4. Widely patent vertebrobasilar system. Electronically Signed   By: Rise Mu M.D.   On: 12/28/2016 01:30   Mr Brain Wo Contrast (neuro Protocol)  Result Date: 12/28/2016 CLINICAL DATA:  Initial evaluation for acute stroke. EXAM: MRI HEAD WITHOUT CONTRAST MRA HEAD WITHOUT CONTRAST TECHNIQUE: Multiplanar, multiecho pulse sequences of the brain and surrounding structures were obtained without intravenous contrast. Angiographic images of the head were obtained using MRA technique without contrast. COMPARISON:  Prior CT from earlier the same day. FINDINGS: MRI HEAD FINDINGS Brain: Study degraded by motion artifact. Mild diffuse prominence of the CSF containing spaces is compatible with generalized age-related cerebral atrophy. Patchy T2/FLAIR hyperintensity within the periventricular and deep white matter both cerebral hemispheres most compatible chronic microvascular ischemic disease, mild  to moderate in nature. Abnormal restricted diffusion involves the right basal ganglia, specifically involving the anterior right lentiform nucleus and right caudate head. Involvement of the anterior limb of the right internal capsule. Associated T2/FLAIR signal abnormality without significant mass effect. This is largely acute in appearance on this exam. No associated hemorrhage. No  other evidence for acute ischemia. No mass lesion, midline shift or mass effect. No hydrocephalus. No extra-axial fluid collection. Major dural sinuses are grossly patent. No intrinsic temporal lobe abnormality. Pituitary gland and suprasellar region within normal limits. Vascular: Abnormal flow void within the left ICA to the terminus. Major intracranial vascular flow voids otherwise maintained. Skull and upper cervical spine: Craniocervical junction normal. Visualized upper cervical spine unremarkable. Bone marrow signal intensity within normal limits. No scalp soft tissue abnormality. Sinuses/Orbits: Globes and orbital soft tissues within normal limits. Scattered mucosal thickening within the ethmoidal air cells and maxillary sinuses. No air-fluid level to suggest active sinus infection. No mastoid effusion. Inner ear structures grossly normal. MRA HEAD FINDINGS ANTERIOR CIRCULATION: Study degraded by motion artifact. Left ICA is occluded to the level of the terminus. Distal cervical right ICA widely patent with antegrade flow. Petrous, cavernous, and supraclinoid right ICA patent without high-grade stenosis. Right A1 segment dominant and widely patent. There is a patent hypoplastic left A1 segment. Anterior communicating artery normal. Anterior cerebral arteries patent to their distal aspects with suspected multifocal atheromatous irregularity. Left A1 segment patent. There is an apparent short-segment severe proximal left M2 stenosis (series 13, image 37). Small vessel atheromatous irregularity distally within the left MCA branches. Short-segment moderate stenosis within the distal right M1 segment (series 13, image 71). Distal small vessel atheromatous irregularity within the right MCA branches. POSTERIOR CIRCULATION: Vertebral arteries code dominant and patent to the vertebrobasilar junction. Posterior inferior cerebral arteries not well evaluated on this motion degraded study. Basilar artery mildly tortuous  but widely patent to its distal aspect. Superior cerebral arteries patent bilaterally. Posterior cerebral arteries largely supplied via the basilar artery and are widely patent to their distal aspects. Small bilateral posterior communicating arteries noted. No aneurysm or vascular malformation. IMPRESSION: MRI HEAD IMPRESSION: 1. 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 No associated hemorrhage or significant mass effect. 2. 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 MRA HEAD IMPRESSION: 1. 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 2. Widely patent right carotid artery system. 3. 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 4. Widely patent vertebrobasilar system. Electronically Signed   By: Rise Mu M.D.   On: 12/28/2016 01:30    Time Spent in minutes  25   Eddie North M.D on 12/30/2016 at 12:05 PM  Between 7am to 7pm - Pager - [phone]  After 7pm go to [email or web address] - password [facility]  [facility] -  Office  [phone]

## 2016-12-30 NOTE — Progress Notes (Signed)
Physical Therapy Treatment Patient Details Name: Ian Thornton MRN: [id number] DOB: 1956-11-26 Today's Date: 12/30/2016    History of Present Illness Ian Thornton an 61 y.o.malewith a history of alcohol abuse brought to the ED by family members on 12/27/16 because of a change in mental status with agitation and confusion. They also noticed visual hallucinations. It's unclear when patient last drank alcohol. He reportedly had a seizure 2-3 days ago. No medical attention was sought. Alcohol level in ED was undetectable. CT scan of his head showed a low density right basal ganglial lesion indicative of acute possible subacute ischemic stroke. It's unclear when he was last known normal. No focal weakness, facial droop has been noted by family.    PT Comments    Upon arrival, pt had just disconnected IV from hand. Nurse and nurse tech in room; nurse communicated approval of activity without IV temporarily but would be readministered after. Pt able to ambulate with minimal correction for unsteadiness. Pt unaware of being off balance when asking; denies any limitations. Pt seemed distracted during ambulation, looking into every room that he passed. PT continue to work on LE strengthening and balance to prepare for SNF at d/c.    Follow Up Recommendations  SNF;Supervision/Assistance - 24 hour     Equipment Recommendations  Other (comment) (TBA)    Recommendations for Other Services       Precautions / Restrictions Precautions Precautions: Fall Restrictions Weight Bearing Restrictions: No    Mobility  Bed Mobility Overal bed mobility: Modified Independent Bed Mobility: Supine to Sit     Supine to sit: Modified independent (Device/Increase time)     General bed mobility comments: HOB up + use of 1 rail  Transfers Overall transfer level: Modified independent Equipment used: None Transfers: Sit to/from Stand Sit to Stand: Modified independent (Device/Increase time)          General transfer comment: pt unsteady but able to perform without assistance  Ambulation/Gait Ambulation/Gait assistance: Min guard Ambulation Distance (Feet): 300 Feet   Gait Pattern/deviations: Step-through pattern;Decreased stride length;Narrow base of support Gait velocity: normal   General Gait Details: Patient with unsteady gait, requiring min guard for balance and safety.   Stairs            Wheelchair Mobility    Modified Rankin (Stroke Patients Only) Modified Rankin (Stroke Patients Only) Pre-Morbid Rankin Score: No symptoms Modified Rankin: Moderately severe disability     Balance     Sitting balance-Leahy Scale: Good       Standing balance-Leahy Scale: Fair Standing balance comment: min guard for safety during ambulation and exercises due to unsteadiness                    Cognition Arousal/Alertness: Awake/alert Behavior During Therapy: Impulsive   Area of Impairment: Safety/judgement;Memory     Memory: Decreased short-term memory (told SPTA wrong room number when asked; needed cuing to enter correct room )   Safety/Judgement: Decreased awareness of deficits;Decreased awareness of safety          Exercises General Exercises - Lower Extremity Hip ABduction/ADduction: Both;10 reps;Strengthening;Standing Hip Flexion/Marching: Strengthening;Both;10 reps;Standing Mini-Sqauts: 10 reps;Standing;Strengthening    General Comments        Pertinent Vitals/Pain Pain Assessment: No/denies pain    Home Living                      Prior Function            PT Goals (current  goals can now be found in the care plan section) Acute Rehab PT Goals Patient Stated Goal: None stated Potential to Achieve Goals: Good Progress towards PT goals: Progressing toward goals    Frequency    Min 2X/week      PT Plan Current plan remains appropriate    Co-evaluation             End of Session Equipment Utilized During  Treatment: Gait belt Activity Tolerance: Patient tolerated treatment well;Treatment limited secondary to agitation Patient left: with nursing/sitter in room;Other (comment) (patient left in bathroom; nursing in room, nurse will set bed alarm)     Time: 1650-1710 PT Time Calculation (min) (ACUTE ONLY): 20 min  Charges:  $Gait Training: 8-22 mins                    G Codes:      [facility] HospitalMary Moody Sean Malinowski 12/30/2016, 5:27 PM Kerrin MoMary M Ed Mandich, [place] Pager [phone]

## 2016-12-31 ENCOUNTER — Inpatient Hospital Stay (HOSPITAL_COMMUNITY): Payer: Self-pay

## 2016-12-31 LAB — URINALYSIS, ROUTINE W REFLEX MICROSCOPIC
BILIRUBIN URINE: NEGATIVE
Glucose, UA: NEGATIVE mg/dL
Hgb urine dipstick: NEGATIVE
KETONES UR: NEGATIVE mg/dL
LEUKOCYTES UA: NEGATIVE
NITRITE: NEGATIVE
PH: 7 (ref 5.0–8.0)
PROTEIN: NEGATIVE mg/dL
Specific Gravity, Urine: 1.013 (ref 1.005–1.030)

## 2016-12-31 NOTE — Progress Notes (Signed)
STROKE TEAM PROGRESS NOTE   SUBJECTIVE (INTERVAL HISTORY) No family at bedside. Pt is lying in bed comfortably. No acute event overnight. VVS consulted on pt and plan for right CEA on 01/12/17.    OBJECTIVE Temp:  [98.9 F (37.2 C)-100.8 F (38.2 C)] 99.5 F (37.5 C) (01/30 2158) Pulse Rate:  [85-102] 102 (01/30 2158) Cardiac Rhythm: Normal sinus rhythm (01/30 1900) Resp:  [16-18] 18 (01/30 2158) BP: (177-200)/(88-105) 200/98 (01/30 2230) SpO2:  [100 %] 100 % (01/30 2158)  CBC:   Recent Labs Lab 12/27/16 1900 12/29/16 0418 12/30/16 0557  WBC 8.8 5.8 7.7  NEUTROABS 6.7  --   --   HGB 13.5 11.4* 11.2*  HCT 40.5 34.3* 34.2*  MCV 101.5* 100.3* 102.4*  PLT 84* 76* 99*    Basic Metabolic Panel:   Recent Labs Lab 12/29/16 0418 12/29/16 0923 12/30/16 0557 12/30/16 0916  NA 135  --  136  --   K 2.9*  --  3.8  --   CL 108  --  107  --   CO2 21*  --  24  --   GLUCOSE 96  --  104*  --   BUN <5*  --  <5*  --   CREATININE 0.55*  --  0.61  --   CALCIUM 7.9*  --  8.5*  --   MG  --  2.2  --  1.8    Lipid Panel:     Component Value Date/Time   CHOL 153 12/28/2016 0124   TRIG 190 (H) 12/28/2016 0124   HDL 28 (L) 12/28/2016 0124   CHOLHDL 5.5 12/28/2016 0124   VLDL 38 12/28/2016 0124   LDLCALC 87 12/28/2016 0124   HgbA1c:  Lab Results  Component Value Date   HGBA1C 5.3 12/28/2016   Urine Drug Screen:     Component Value Date/Time   LABOPIA NONE DETECTED 12/28/2016 1548   COCAINSCRNUR NONE DETECTED 12/28/2016 1548   LABBENZ POSITIVE (A) 12/28/2016 1548   AMPHETMU NONE DETECTED 12/28/2016 1548   THCU NONE DETECTED 12/28/2016 1548   LABBARB NONE DETECTED 12/28/2016 1548      IMAGING I have personally reviewed the radiological images below and agree with the radiology interpretations.  Ct Head Wo Contrast 12/27/2016 1. Evolving acute/early subacute ischemic right basal ganglia infarct.  2. Small remote lacunar infarct within the left basal ganglia.  3.  Age-related cerebral atrophy with chronic microvascular ischemic disease.   MRI HEAD   12/28/2016 1. 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 No associated hemorrhage or significant mass effect.  2. 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   MRA HEAD  12/28/2016 1. 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  2. Widely patent right carotid artery system.  3. 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  4. Widely patent vertebrobasilar system.   EEG  This is essentially a normal awake, drowsy, sleep EEG. There is excessive bifrontal beta with reduced voltages. This EEG shows nothing to suggest a increased likelihood toward seizures. The presence of excessive beta and reduced voltages is consistent with administration of benzodiazepines and/or anxiety. In this case it would seem to be the former as he has been receiving Ativan during this admission. Clinical correlation would be required.  CTA head and neck 1. Right anterior basal ganglia acute infarction stable in size in comparison with prior MRI. No hemorrhage identified.  2. Occlusion of the left internal carotid artery from bifurcation  to paraclinoid segment with patent terminus. This may represent sequelae of age indeterminate dissection or atherosclerotic disease. 3. Proximal right ICA severe 90% stenosis secondary to dense calcified plaque. 4. Patent vertebral arteries in the neck without high-grade stenosis. 5. Motion degraded CTA of the head. No high-grade stenosis, proximal occlusion, aneurysm, or vascular malformation of the anterior cerebral arteries, middle cerebral arteries, or posterior circulation identified. 6. 4 mm pulmonary nodules in the right upper and right lower lobe. No follow-up needed if patient is low-risk (and has no known or suspected primary neoplasm). Non-contrast chest CT can be considered  TTE  - Left ventricle: The cavity  size was normal. Wall thickness was increased in a pattern of mild LVH. There was mild focal basal hypertrophy of the septum. Systolic function was normal. The estimated ejection fraction was in the range of 50% to 55%. Wall motion was normal; there were no regional wall motion abnormalities. Doppler parameters are consistent with abnormal left ventricular relaxation (grade 1 diastolic dysfunction). - Mitral valve: Calcified annulus. Impressions:   Normal LV systolic function; grade 1 diastolic dysfunction.   PHYSICAL EXAM  Temp:  [98.9 F (37.2 C)-100.8 F (38.2 C)] 99.5 F (37.5 C) (01/30 2158) Pulse Rate:  [85-102] 102 (01/30 2158) Resp:  [16-18] 18 (01/30 2158) BP: (177-200)/(88-105) 200/98 (01/30 2230) SpO2:  [100 %] 100 % (01/30 2158)  General - Well nourished, well developed, mild to moderate agitation on bilateral mittens.  Ophthalmologic - Fundi not visualized due to not cooperative.  Cardiovascular - Regular rate and rhythm.  Neuro - drowsy sleepy but arousable, mild agitation on bilateral mittens. Able to follow most simple commands, orientated to place and self, but not to time or people. Able to name 2/3 and repeat simple sentences but moderate dysarthia. No neglect, attending to both sides. PERRL, EOMI, no nystagmus seen. No significant facial asymmetry. Tongue in middle. Moving all extremities equally and symmetrically. Not cooperative on coordination test, but grossly no ataxia. Bilateral hand tremor present. Sensation intact. Gait not tested.    ASSESSMENT/PLAN Mr. Adel Burch is a 61 y.o. male with history of alcohol abuse, smoker, seizure 2-3 days ago presenting with AMS and agitation with confusion. He did not receive IV t-PA due to unknown LSN.   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 No source of embolus   VVS consulted and plan for right CEA on 01/12/17  LDL 87  HgbA1c 5.3  SCDs for VTE prophylaxis Diet regular Room service appropriate? Yes; Fluid consistency: Thin  No antithrombotic prior to admission, now on aspirin 325 mg daily. Continue ASA. Do not recommend DAPT due to thrombocytopenia.  Patient counseled to be compliant with his antithrombotic medications  Ongoing aggressive stroke risk factor management  Therapy recommendations:  SNF  Disposition:  pending   Carotid occlusion/stenosis  CTA head and neck -  L ICA occluded, but R ICA 90% stenosis d/t plaque  Symptomatic right ICA stenosis  vascular surgery plan for right CEA on 01/12/17   Continue ASA, do not recommend DAPT due to thrombocytopenia.  Thrombocytopenia   Platelet 84->76->99  Likely due to alcohol abuse  Continue ASA for now  Hold off ASA if platelet < 50  Hypertension  BP fluctuate  Permissive hypertension (OK if < 220/120) but gradually normalize in 5-7 days  Long-term BP goal normotensive  Hyperlipidemia  Home meds:  none  LDL 87, goal < 70  lipitor 40 mg daily   Continue statin at discharge  Tobacco abuse  Current smoker  Smoking cessation counseling provided  Nicotine patch provided  Pt is willing to quit  Other Active Problems  Hyponatremia  Hypokalemia  Malnutrition   Dehydration - Cre 1.83 -> 1.32 ->0.85 -> 0.55 ->0.61  Hospital day # 4    Neurology will sign off. Please call with questions. Pt [facility]. Thanks for the consult.   Marvel PlanJindong Enrika Aguado, MD PhD Stroke Neurology 12/31/2016 12:00 AM   To contact Stroke Continuity provider, please refer to [email or web address]. After hours, contact General Neurology

## 2016-12-31 NOTE — Progress Notes (Addendum)
[facility]                                                                              Patient Demographics  Ian Thornton, is a 61 y.o. male, DOB - 1956/06/22, AVW:[id number]  Admit date - 12/27/2016   Admitting Physician Hillary Bow, DO  Outpatient Primary MD for the patient is No primary care provider on file.  Outpatient specialists:   LOS - 4  days    Chief Complaint  Patient presents with  . Weakness       Brief summary   61 year old male with significant alcohol abuse heavy smoker who was brought to the ED by family with change in mental status for past few days with agitation and confusion. Family also noted visual hallucination. Patient apparently had stopped drinking last few days and reportedly also had seizures 2-3 days back. In the ED vitals were stable patient was confused, hallucinating and confabulating. Alcohol level was undetectable. Placed on CIWA. MRI of the brain done showing acute right basal ganglia infarct. The patient was admitted to the stepdown unit and stroke team was consulted.   Assessment & Plan     Principal Problem:   CVA (cerebral vascular accident) (HCC) - Acute right basal ganglia ischemic stroke. No residual weakness. May have some speech/attention deficiency which could be due to alcohol withdrawal. - Neurology was consulted. Patient was placed on aspirin. Recommend against DAPT due to thrombocytopenia.  - LDL of 87 with high triglycerides.  A1c 5.3. - PT recommends SNF. Needs outpatient speech therapy. - 2-D echo with normal EF and no wall motion abnormality, no cardiac source of emboli. - CT angiogram of the head and neck shows 90% stenosis of rt ICA and occluded left ICA..   - Vascular surgery was consulted as carotid Doppler showed right 80-99% ICA stenosis and occluded left ICA. - Recommend right CEA on 01/12/2017   Active Problems: Alcohol abuse/withdrawal with  DTs (delirium tremens) (HCC) - Monitor on  CIWA. Receiving  thiamine, folate and multivitamin.  - No signs of DTs at present but still has mild withdrawal.  - Counseled strongly on cessation.    Thrombocytopenia (HCC) - Possibly alcohol-induced marrow suppression. -  Slowly improving.     Alcohol withdrawal seizure without complication (HCC)  - Seizure precautions.  - Monitor on CIWA. -  EEG negative for epilepsy.  No anticonvulsants needed.  Tobacco use - nicotine patch. Counseled on cessation.  Severe hypokalemia/hypomagnesemia - Replenished.    transaminitis -Possibly alcohol induced hepatitis. Improving.  Fever - Unclear etiology, obtain UA and culture, blood cultures, chest x-ray to initiate workup   Code Status: full code  DVT Prophylaxis:  SCD's Family Communication: Discussed in detail with the patient, all imaging results, lab results explained to the patient   Disposition Plan: once SNF available   Time Spent in minutes   25 minutes  Procedures:  Head CT MRI brain 2-D echo   CT angiogram head and  Carotid ultrasound  Consultants:   Neurology Vascular surgery  Antimicrobials:      Medications  Scheduled Meds: .  stroke: mapping our early stages of  recovery book   Does not apply Once  . aspirin EC  325 mg Oral Daily   Or  . aspirin  300 mg Rectal Daily  . atorvastatin  40 mg Oral q1800  . feeding supplement (ENSURE ENLIVE)  237 mL Oral BID BM  . folic acid  1 mg Oral Daily  . multivitamin with minerals  1 tablet Oral Daily  . nicotine  21 mg Transdermal Daily  . thiamine  100 mg Oral Daily   Continuous Infusions: PRN Meds:.acetaminophen **OR** acetaminophen (TYLENOL) oral liquid 160 mg/5 mL **OR** acetaminophen, LORazepam   Antibiotics   Anti-infectives    None        Subjective:   Braxton FeathersBernard Ruhland was seen and examined today.  No significant complaints, still has some tremors. No active hallucinations. Patient denies dizziness, chest pain, shortness of breath,  abdominal pain, N/V/D/C, new weakness, numbess, tingling. No acute events overnight.    Objective:   Vitals:   12/30/16 2230 12/31/16 0015 12/31/16 0543 12/31/16 1424  BP: (!) 200/98 (!) 189/90 (!) 167/89 131/66  Pulse:  100 (!) 106 (!) 113  Resp:  16 16 20   Temp:  98.7 F (37.1 C) 99.8 F (37.7 C) (!) 101.9 F (38.8 C)  TempSrc:   Oral Oral  SpO2:  100% 100% 97%  Weight:      Height:        Intake/Output Summary (Last 24 hours) at 12/31/16 1440 Last data filed at 12/31/16 1007  Gross per 24 hour  Intake              495 ml  Output              350 ml  Net              145 ml     Wt Readings from Last 3 Encounters:  12/28/16 62.7 kg (138 lb 3.2 oz)     Exam  General: Alert and oriented x 2, NAD, Some attention deficiency.  HEENT:  PERRLA, EOMI, Anicteric Sclera, mucous membranes moist.   Neck: Supple, no JVD, no masses  Cardiovascular: S1 S2 auscultated, no rubs, murmurs or gallops. Regular rate and rhythm.  Respiratory: Clear to auscultation bilaterally, no wheezing, rales or rhonchi  Gastrointestinal: Soft, nontender, nondistended, + bowel sounds  Ext: no cyanosis clubbing or edema, tremors+  Neuro: AAOx3, Cr N's II- XII. Strength 5/5 upper and lower extremities bilaterally  Skin: No rashes  Psych: Normal affect and demeanor, alert and oriented x2    Data Reviewed:  I have personally reviewed following labs and imaging studies  Micro Results Recent Results (from the past 240 hour(s))  Culture, blood (Routine x 2)     Status: None (Preliminary result)   Collection Time: 12/27/16  8:36 PM  Result Value Ref Range Status   Specimen Description BLOOD RIGHT ANTECUBITAL  Final   Special Requests IN PEDIATRIC BOTTLE 2CC  Final   Culture NO GROWTH 3 DAYS  Final   Report Status PENDING  Incomplete  Culture, blood (Routine x 2)     Status: None (Preliminary result)   Collection Time: 12/27/16  8:36 PM  Result Value Ref Range Status   Specimen Description  BLOOD RIGHT HAND  Final   Special Requests BOTTLES DRAWN AEROBIC AND ANAEROBIC 5CC  Final   Culture NO GROWTH 3 DAYS  Final   Report Status PENDING  Incomplete  Urine culture     Status: Abnormal   Collection Time: 12/27/16  8:40 PM  Result Value Ref Range Status   Specimen Description URINE, RANDOM  Final   Special Requests NONE  Final   Culture MULTIPLE SPECIES PRESENT, SUGGEST RECOLLECTION (A)  Final   Report Status 12/29/2016 FINAL  Final  MRSA PCR Screening     Status: None   Collection Time: 12/28/16  3:02 AM  Result Value Ref Range Status   MRSA by PCR NEGATIVE NEGATIVE Final    Comment:        The GeneXpert MRSA Assay (FDA approved for NASAL specimens only), is one component of a comprehensive MRSA colonization surveillance program. It is not intended to diagnose MRSA infection nor to guide or monitor treatment for MRSA infections.     Radiology Reports Ct Angio Head W Or Wo Contrast  Result Date: 12/28/2016 CLINICAL DATA:  61 y/o M; weakness history of ETOH abuse currently in delirium tremens. EXAM: CT ANGIOGRAPHY HEAD AND NECK TECHNIQUE: Multidetector CT imaging of the head and neck was performed using the standard protocol during bolus administration of intravenous contrast. Multiplanar CT image reconstructions and MIPs were obtained to evaluate the vascular anatomy. Carotid stenosis measurements (when applicable) are obtained utilizing NASCET criteria, using the distal internal carotid diameter as the denominator. CONTRAST:  50 cc Isovue 370 COMPARISON:  12/27/2016 MRI and MRA of the head. FINDINGS: CT HEAD FINDINGS Brain: Area of hypoattenuation within the right anterior basal ganglia involving caudate head and lentiform nucleus corresponding to diffusion signal abnormality on MRI consistent with evolving acute infarction. No acute hemorrhage or new area of infarction. No significant mass effect. No extra-axial collection or hydrocephalus. Stable chronic lacunar infarct  within left caudate head, mild volume loss of the brain, and mild chronic microvascular ischemic changes. Vascular: As below. Skull: Normal. Negative for fracture or focal lesion. Sinuses: Imaged portions are clear. Orbits: No acute finding. Review of the MIP images confirms the above findings CTA NECK FINDINGS Aortic arch: Mild aortic atherosclerosis with arch calcification. Four vessel arch. Right carotid system: Dense calcified plaque of right carotid bifurcation with severe 90% stenosis of proximal right ICA. Areas of fibrofatty plaque of right common carotid artery without significant stenosis. Left carotid system: Patent left common carotid artery. Occlusion of left internal carotid artery from the bifurcation to the carotid terminus which is patent. Vertebral arteries: Codominant. No evidence of dissection, stenosis (50% or greater) or occlusion. Skeleton: Mild cervical spondylosis greatest at the C6-7 level. No high-grade bony canal stenosis or foraminal narrowing. Other neck: Negative appear Upper chest: 4 mm right upper lobe and right lower lobe pulmonary nodules (series 601, image 38 and 5). Mild centrilobular emphysematous changes with a large right upper lobe bulla anterior to mediastinum. Review of the MIP images confirms the above findings CTA HEAD FINDINGS Moderate motion artifact. Suboptimal evaluation for subtle stenosis or small aneurysm. Anterior circulation: Calcific atherosclerosis of right cavernous and supraclinoid segments without high-grade stenosis. Occluded left internal carotid artery to the level of the terminus which is patent. Bilateral middle cerebral arteries, anterior cerebral arteries, and branches are patent. Otherwise no high-grade stenosis, proximal occlusion, aneurysm, or vascular malformation identified. Posterior circulation: No high-grade stenosis, proximal occlusion, aneurysm, or vascular malformation. Venous sinuses: As permitted by contrast timing, patent. Anatomic  variants: Patent anterior communicating and bilateral posterior communicating arteries. Delayed phase: No abnormal intracranial enhancement. Review of the MIP images confirms the above findings IMPRESSION: 1. Right anterior basal ganglia acute infarction stable in size in comparison with prior MRI. No hemorrhage identified. 2. Occlusion  of the left internal carotid artery from bifurcation to paraclinoid segment with patent terminus. This may represent sequelae of age indeterminate dissection or atherosclerotic disease. 3. Proximal right ICA severe 90% stenosis secondary to dense calcified plaque. 4. Patent vertebral arteries in the neck without high-grade stenosis. 5. Motion degraded CTA of the head. No high-grade stenosis, proximal occlusion, aneurysm, or vascular malformation of the anterior cerebral arteries, middle cerebral arteries, or posterior circulation identified. 6. 4 mm pulmonary nodules in the right upper and right lower lobe. No follow-up needed if patient is low-risk (and has no known or suspected primary neoplasm). Non-contrast chest CT can be considered in 12 months if patient is high-risk. This recommendation follows the consensus statement: Guidelines for Management of Incidental Pulmonary Nodules Detected on CT Images: From the Fleischner Society 2017; Radiology 2017; 284:228-243. 7. Mild centrilobular emphysema. Electronically Signed   By: Mitzi Hansen M.D.   On: 12/28/2016 17:06   Dg Chest 2 View  Result Date: 12/27/2016 CLINICAL DATA:  Weakness and weight loss. EXAM: CHEST  2 VIEW COMPARISON:  Report from 08/09/2000 FINDINGS: The heart size and mediastinal contours are within normal limits. The lungs are mildly hyperinflated without pneumonic consolidation, CHF nor effusion. No pneumothorax. Bilateral nipple shadows are seen over the lower lobes. The visualized skeletal structures are unremarkable. IMPRESSION: No active cardiopulmonary disease. Mild hyperinflation of the lungs.  Electronically Signed   By: Tollie Eth M.D.   On: 12/27/2016 19:27   Dg Abd 1 View  Result Date: 12/27/2016 CLINICAL DATA:  Pre MRI for foreign body in the intestine. EXAM: ABDOMEN - 1 VIEW COMPARISON:  None. FINDINGS: The bowel gas pattern is normal. No radio-opaque calculi nor foreign body identified. Osteophytes are seen along the dorsal spine. No acute osseous abnormality. No free air. IMPRESSION: No metallic foreign body in the abdomen or pelvis. Unremarkable bowel gas pattern. Electronically Signed   By: Tollie Eth M.D.   On: 12/27/2016 23:43   Ct Head Wo Contrast  Result Date: 12/27/2016 CLINICAL DATA:  Initial evaluation for acute weakness. EXAM: CT HEAD WITHOUT CONTRAST TECHNIQUE: Contiguous axial images were obtained from the base of the skull through the vertex without intravenous contrast. COMPARISON:  Prior CT from 05/25/2005. FINDINGS: Brain: Diffuse prominence of the CSF containing spaces is compatible with generalized cerebral atrophy. Patchy hypodensity within the periventricular and deep white matter of both cerebral hemispheres is most consistent with chronic microvascular ischemic disease. Small remote lacunar infarct within the left basal ganglia. There is evolving hypodensity involving the right basal ganglia, with involvement of the right caudate and anterior lenticular nucleus, compatible with acute ischemic infarct. No associated mass effect or hemorrhage. No other acute intracranial hemorrhage or infarct identified. No mass lesion, midline shift, or mass effect. No hydrocephalus. No extra-axial fluid collection. Vascular: No hyperdense vessel. Scattered vascular calcifications noted within the carotid siphons. Skull: Focal hyperdensity noted at the right frontal scalp, of doubtful clinical significance. Scalp soft tissues demonstrate no acute abnormality. Calvarium intact. Sinuses/Orbits: Globes and orbital soft tissues within normal limits. Paranasal sinuses and mastoid air cells  are clear. IMPRESSION: 1. Evolving acute/early subacute ischemic right basal ganglia infarct. 2. Small remote lacunar infarct within the left basal ganglia. 3. Age-related cerebral atrophy with chronic microvascular ischemic disease. Electronically Signed   By: Rise Mu M.D.   On: 12/27/2016 21:51   Ct Angio Neck W Or Wo Contrast  Result Date: 12/28/2016 CLINICAL DATA:  61 y/o M; weakness history of ETOH abuse currently in  delirium tremens. EXAM: CT ANGIOGRAPHY HEAD AND NECK TECHNIQUE: Multidetector CT imaging of the head and neck was performed using the standard protocol during bolus administration of intravenous contrast. Multiplanar CT image reconstructions and MIPs were obtained to evaluate the vascular anatomy. Carotid stenosis measurements (when applicable) are obtained utilizing NASCET criteria, using the distal internal carotid diameter as the denominator. CONTRAST:  50 cc Isovue 370 COMPARISON:  12/27/2016 MRI and MRA of the head. FINDINGS: CT HEAD FINDINGS Brain: Area of hypoattenuation within the right anterior basal ganglia involving caudate head and lentiform nucleus corresponding to diffusion signal abnormality on MRI consistent with evolving acute infarction. No acute hemorrhage or new area of infarction. No significant mass effect. No extra-axial collection or hydrocephalus. Stable chronic lacunar infarct within left caudate head, mild volume loss of the brain, and mild chronic microvascular ischemic changes. Vascular: As below. Skull: Normal. Negative for fracture or focal lesion. Sinuses: Imaged portions are clear. Orbits: No acute finding. Review of the MIP images confirms the above findings CTA NECK FINDINGS Aortic arch: Mild aortic atherosclerosis with arch calcification. Four vessel arch. Right carotid system: Dense calcified plaque of right carotid bifurcation with severe 90% stenosis of proximal right ICA. Areas of fibrofatty plaque of right common carotid artery without  significant stenosis. Left carotid system: Patent left common carotid artery. Occlusion of left internal carotid artery from the bifurcation to the carotid terminus which is patent. Vertebral arteries: Codominant. No evidence of dissection, stenosis (50% or greater) or occlusion. Skeleton: Mild cervical spondylosis greatest at the C6-7 level. No high-grade bony canal stenosis or foraminal narrowing. Other neck: Negative appear Upper chest: 4 mm right upper lobe and right lower lobe pulmonary nodules (series 601, image 38 and 5). Mild centrilobular emphysematous changes with a large right upper lobe bulla anterior to mediastinum. Review of the MIP images confirms the above findings CTA HEAD FINDINGS Moderate motion artifact. Suboptimal evaluation for subtle stenosis or small aneurysm. Anterior circulation: Calcific atherosclerosis of right cavernous and supraclinoid segments without high-grade stenosis. Occluded left internal carotid artery to the level of the terminus which is patent. Bilateral middle cerebral arteries, anterior cerebral arteries, and branches are patent. Otherwise no high-grade stenosis, proximal occlusion, aneurysm, or vascular malformation identified. Posterior circulation: No high-grade stenosis, proximal occlusion, aneurysm, or vascular malformation. Venous sinuses: As permitted by contrast timing, patent. Anatomic variants: Patent anterior communicating and bilateral posterior communicating arteries. Delayed phase: No abnormal intracranial enhancement. Review of the MIP images confirms the above findings IMPRESSION: 1. Right anterior basal ganglia acute infarction stable in size in comparison with prior MRI. No hemorrhage identified. 2. 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 This may represent sequelae of age indeterminate dissection or atherosclerotic disease. 3. Proximal right ICA severe 90% stenosis secondary to dense  calcified plaque. 4. Patent vertebral arteries in the neck without high-grade stenosis. 5. Motion degraded CTA of the head. No high-grade stenosis, proximal occlusion, aneurysm, or vascular malformation of the anterior cerebral arteries, middle cerebral arteries, or posterior circulation identified. 6. 4 mm pulmonary nodules in the right upper and right lower lobe. No follow-up needed if patient is low-risk (and has no known or suspected primary neoplasm). Non-contrast chest CT can be considered in 12 months if patient is high-risk. This recommendation follows the consensus statement: Guidelines for Management of Incidental Pulmonary Nodules Detected on CT Images: From the Fleischner Society 2017; Radiology 2017; [id number]. 7. Mild centrilobular emphysema. Electronically Signed   By: Micah Noel  [place]urusawa-Stratton M.D.   On: 12/28/2016 17:06   Mr Maxine Glenn Head Wo Contrast  Result Date: 12/28/2016 CLINICAL DATA:  Initial evaluation for acute stroke. EXAM: MRI HEAD WITHOUT CONTRAST MRA HEAD WITHOUT CONTRAST TECHNIQUE: Multiplanar, multiecho pulse sequences of the brain and surrounding structures were obtained without intravenous contrast. Angiographic images of the head were obtained using MRA technique without contrast. COMPARISON:  Prior CT from earlier the same day. FINDINGS: MRI HEAD FINDINGS Brain: Study degraded by motion artifact. Mild diffuse prominence of the CSF containing spaces is compatible with generalized age-related cerebral atrophy. Patchy T2/FLAIR hyperintensity within the periventricular and deep white matter both cerebral hemispheres most compatible chronic microvascular ischemic disease, mild to moderate in nature. Abnormal restricted diffusion involves the right basal ganglia, specifically involving the anterior right lentiform nucleus and right caudate head. Involvement of the anterior limb of the right internal capsule. Associated T2/FLAIR signal abnormality without significant mass effect. This is  largely acute in appearance on this exam. No associated hemorrhage. No other evidence for acute ischemia. No mass lesion, midline shift or mass effect. No hydrocephalus. No extra-axial fluid collection. Major dural sinuses are grossly patent. No intrinsic temporal lobe abnormality. Pituitary gland and suprasellar region within normal limits. Vascular: Abnormal flow void within the left ICA to the terminus. Major intracranial vascular flow voids otherwise maintained. Skull and upper cervical spine: Craniocervical junction normal. Visualized upper cervical spine unremarkable. Bone marrow signal intensity within normal limits. No scalp soft tissue abnormality. Sinuses/Orbits: Globes and orbital soft tissues within normal limits. Scattered mucosal thickening within the ethmoidal air cells and maxillary sinuses. No air-fluid level to suggest active sinus infection. No mastoid effusion. Inner ear structures grossly normal. MRA HEAD FINDINGS ANTERIOR CIRCULATION: Study degraded by motion artifact. Left ICA is occluded to the level of the terminus. Distal cervical right ICA widely patent with antegrade flow. Petrous, cavernous, and supraclinoid right ICA patent without high-grade stenosis. Right A1 segment dominant and widely patent. There is a patent hypoplastic left A1 segment. Anterior communicating artery normal. Anterior cerebral arteries patent to their distal aspects with suspected multifocal atheromatous irregularity. Left A1 segment patent. There is an apparent short-segment severe proximal left M2 stenosis (series 13, image 37). Small vessel atheromatous irregularity distally within the left MCA branches. Short-segment moderate stenosis within the distal right M1 segment (series 13, image 71). Distal small vessel atheromatous irregularity within the right MCA branches. POSTERIOR CIRCULATION: Vertebral arteries code dominant and patent to the vertebrobasilar junction. Posterior inferior cerebral arteries not well  evaluated on this motion degraded study. Basilar artery mildly tortuous but widely patent to its distal aspect. Superior cerebral arteries patent bilaterally. Posterior cerebral arteries largely supplied via the basilar artery and are widely patent to their distal aspects. Small bilateral posterior communicating arteries noted. No aneurysm or vascular malformation. IMPRESSION: MRI HEAD IMPRESSION: 1. 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 No associated hemorrhage or significant mass effect. 2. 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 MRA HEAD IMPRESSION: 1. 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 2. Widely patent right carotid artery system. 3. 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 4. Widely patent vertebrobasilar system. Electronically Signed   By: Rise Mu M.D.   On: 12/28/2016 01:30   Mr Brain Wo Contrast (neuro Protocol)  Result Date: 12/28/2016 CLINICAL DATA:  Initial evaluation for acute stroke. EXAM: MRI HEAD WITHOUT CONTRAST MRA HEAD WITHOUT CONTRAST TECHNIQUE: Multiplanar, multiecho pulse sequences of the brain and surrounding structures  were obtained without intravenous contrast. Angiographic images of the head were obtained using MRA technique without contrast. COMPARISON:  Prior CT from earlier the same day. FINDINGS: MRI HEAD FINDINGS Brain: Study degraded by motion artifact. Mild diffuse prominence of the CSF containing spaces is compatible with generalized age-related cerebral atrophy. Patchy T2/FLAIR hyperintensity within the periventricular and deep white matter both cerebral hemispheres most compatible chronic microvascular ischemic disease, mild to moderate in nature. Abnormal restricted diffusion involves the right basal ganglia, specifically involving the anterior right lentiform nucleus and right caudate head. Involvement of the anterior limb of the right internal capsule.  Associated T2/FLAIR signal abnormality without significant mass effect. This is largely acute in appearance on this exam. No associated hemorrhage. No other evidence for acute ischemia. No mass lesion, midline shift or mass effect. No hydrocephalus. No extra-axial fluid collection. Major dural sinuses are grossly patent. No intrinsic temporal lobe abnormality. Pituitary gland and suprasellar region within normal limits. Vascular: Abnormal flow void within the left ICA to the terminus. Major intracranial vascular flow voids otherwise maintained. Skull and upper cervical spine: Craniocervical junction normal. Visualized upper cervical spine unremarkable. Bone marrow signal intensity within normal limits. No scalp soft tissue abnormality. Sinuses/Orbits: Globes and orbital soft tissues within normal limits. Scattered mucosal thickening within the ethmoidal air cells and maxillary sinuses. No air-fluid level to suggest active sinus infection. No mastoid effusion. Inner ear structures grossly normal. MRA HEAD FINDINGS ANTERIOR CIRCULATION: Study degraded by motion artifact. Left ICA is occluded to the level of the terminus. Distal cervical right ICA widely patent with antegrade flow. Petrous, cavernous, and supraclinoid right ICA patent without high-grade stenosis. Right A1 segment dominant and widely patent. There is a patent hypoplastic left A1 segment. Anterior communicating artery normal. Anterior cerebral arteries patent to their distal aspects with suspected multifocal atheromatous irregularity. Left A1 segment patent. There is an apparent short-segment severe proximal left M2 stenosis (series 13, image 37). Small vessel atheromatous irregularity distally within the left MCA branches. Short-segment moderate stenosis within the distal right M1 segment (series 13, image 71). Distal small vessel atheromatous irregularity within the right MCA branches. POSTERIOR CIRCULATION: Vertebral arteries code dominant and patent  to the vertebrobasilar junction. Posterior inferior cerebral arteries not well evaluated on this motion degraded study. Basilar artery mildly tortuous but widely patent to its distal aspect. Superior cerebral arteries patent bilaterally. Posterior cerebral arteries largely supplied via the basilar artery and are widely patent to their distal aspects. Small bilateral posterior communicating arteries noted. No aneurysm or vascular malformation. IMPRESSION: MRI HEAD IMPRESSION: 1. 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 No associated hemorrhage or significant mass effect. 2. 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 MRA HEAD IMPRESSION: 1. 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 2. Widely patent right carotid artery system. 3. 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 4. Widely patent vertebrobasilar system. Electronically Signed   By: Rise Mu M.D.   On: 12/28/2016 01:30    Lab Data:  CBC:  Recent Labs Lab 12/27/16 1900 12/29/16 0418 12/30/16 0557  WBC 8.8 5.8 7.7  NEUTROABS 6.7  --   --   HGB 13.5 11.4* 11.2*  HCT 40.5 34.3* 34.2*  MCV 101.5* 100.3* 102.4*  PLT 84* 76* 99*   Basic Metabolic Panel:  Recent Labs Lab 12/27/16 1900 12/28/16 0139 12/28/16 0154 12/28/16 1234 12/29/16 0418 12/29/16 0923 12/30/16 0557 12/30/16 0916  NA 132*  --  134* 134* 135  --  136  --   K 2.9*  --  2.8* 3.0* 2.9*  --  3.8  --   CL 97*  --  100* 104 108  --  107  --   CO2 18*  --  17* 18* 21*  --  24  --   GLUCOSE 82  --  75 70 96  --  104*  --   BUN 13  --  15 10 <5*  --  <5*  --   CREATININE 1.83*  --  1.32* 0.85 0.55*  --  0.61  --   CALCIUM 9.0  --  8.0* 8.2* 7.9*  --  8.5*  --   MG  --  1.6*  --   --   --  2.2  --  1.8   GFR: Estimated Creatinine Clearance: 89.3 mL/min (by C-G formula based on SCr of 0.61 mg/dL). Liver Function Tests:  Recent Labs Lab 12/27/16 1900 12/29/16 0418  12/30/16 0557  AST 198* 123* 80*  ALT 45 38 35  ALKPHOS 197* 112 113  BILITOT 1.8* 1.1 1.2  PROT 8.2* 6.6 6.7  ALBUMIN 3.6 2.8* 2.9*   No results for input(s): LIPASE, AMYLASE in the last 168 hours. No results for input(s): AMMONIA in the last 168 hours. Coagulation Profile:  Recent Labs Lab 12/27/16 1900  INR 1.10   Cardiac Enzymes: No results for input(s): CKTOTAL, CKMB, CKMBINDEX, TROPONINI in the last 168 hours. BNP (last 3 results) No results for input(s): PROBNP in the last 8760 hours. HbA1C: No results for input(s): HGBA1C in the last 72 hours. CBG: No results for input(s): GLUCAP in the last 168 hours. Lipid Profile: No results for input(s): CHOL, HDL, LDLCALC, TRIG, CHOLHDL, LDLDIRECT in the last 72 hours. Thyroid Function Tests: No results for input(s): TSH, T4TOTAL, FREET4, T3FREE, THYROIDAB in the last 72 hours. Anemia Panel: No results for input(s): VITAMINB12, FOLATE, FERRITIN, TIBC, IRON, RETICCTPCT in the last 72 hours. Urine analysis:    Component Value Date/Time   COLORURINE AMBER (A) 12/27/2016 2040   APPEARANCEUR CLOUDY (A) 12/27/2016 2040   LABSPEC 1.027 12/27/2016 2040   PHURINE 5.0 12/27/2016 2040   GLUCOSEU NEGATIVE 12/27/2016 2040   HGBUR SMALL (A) 12/27/2016 2040   BILIRUBINUR MODERATE (A) 12/27/2016 2040   KETONESUR NEGATIVE 12/27/2016 2040   PROTEINUR 100 (A) 12/27/2016 2040   NITRITE NEGATIVE 12/27/2016 2040   LEUKOCYTESUR NEGATIVE 12/27/2016 2040     Maclovio Henson M.D. [facility] 12/31/2016, 2:40 PM  Pager: [phone] Between 7am to 7pm - call Pager - [phone]  After 7pm go to [email or web address] - password [email or web address]  Call night coverage person covering after 7pm

## 2017-01-01 DIAGNOSIS — L03818 Cellulitis of other sites: Secondary | ICD-10-CM

## 2017-01-01 DIAGNOSIS — L0390 Cellulitis, unspecified: Secondary | ICD-10-CM | POA: Diagnosis present

## 2017-01-01 LAB — BASIC METABOLIC PANEL
ANION GAP: 8 (ref 5–15)
BUN: 8 mg/dL (ref 6–20)
CALCIUM: 9.4 mg/dL (ref 8.9–10.3)
CO2: 23 mmol/L (ref 22–32)
Chloride: 101 mmol/L (ref 101–111)
Creatinine, Ser: 0.84 mg/dL (ref 0.61–1.24)
GFR calc Af Amer: >60 mL/min (ref >60)
GLUCOSE: 90 mg/dL (ref 65–99)
POTASSIUM: 3.8 mmol/L (ref 3.5–5.1)
Sodium: 132 mmol/L — ABNORMAL LOW (ref 135–145)

## 2017-01-01 LAB — MAGNESIUM: Magnesium: 1.9 mg/dL (ref 1.7–2.4)

## 2017-01-01 LAB — CBC
HEMATOCRIT: 36.9 % — AB (ref 39.0–52.0)
Hemoglobin: 12.1 g/dL — ABNORMAL LOW (ref 13.0–17.0)
MCH: 33.4 pg (ref 26.0–34.0)
MCHC: 32.8 g/dL (ref 30.0–36.0)
MCV: 101.9 fL — AB (ref 78.0–100.0)
PLATELETS: 174 10*3/uL (ref 150–400)
RBC: 3.62 MIL/uL — AB (ref 4.22–5.81)
RDW: 16.4 % — ABNORMAL HIGH (ref 11.5–15.5)
WBC: 9 10*3/uL (ref 4.0–10.5)

## 2017-01-01 LAB — CULTURE, BLOOD (ROUTINE X 2)
CULTURE: NO GROWTH
CULTURE: NO GROWTH

## 2017-01-01 LAB — URINE CULTURE

## 2017-01-01 MED ORDER — CEFAZOLIN SODIUM-DEXTROSE 2-4 GM/100ML-% IV SOLN
2.0000 g | Freq: Three times a day (TID) | INTRAVENOUS | Status: DC
  Administered 2017-01-01 – 2017-01-02 (×4): 2 g via INTRAVENOUS
  Filled 2017-01-01 (×5): qty 100

## 2017-01-01 MED ORDER — ATORVASTATIN CALCIUM 40 MG PO TABS: 40.0000 mg | ORAL_TABLET | Freq: Every day | ORAL | 3 refills | Status: AC

## 2017-01-01 MED ORDER — ASPIRIN 325 MG PO TBEC: 325.0000 mg | DELAYED_RELEASE_TABLET | Freq: Every day | ORAL | 5 refills | Status: AC

## 2017-01-01 MED ORDER — DOXYCYCLINE HYCLATE 100 MG PO CAPS: 100.0000 mg | ORAL_CAPSULE | Freq: Two times a day (BID) | ORAL | 0 refills | Status: DC

## 2017-01-01 MED ORDER — FOLIC ACID 1 MG PO TABS: 1.0000 mg | ORAL_TABLET | Freq: Every day | ORAL | 3 refills | Status: AC

## 2017-01-01 MED ORDER — ADULT MULTIVITAMIN W/MINERALS CH: 1.0000 | ORAL_TABLET | Freq: Every day | ORAL | 3 refills | Status: AC

## 2017-01-01 NOTE — Progress Notes (Signed)
SLP Cancellation Note  Patient Details Name: Ian Thornton MRN: [id number] DOB: 01-13-1956   Cancelled treatment:        Attempted to see for cognitive treatment. RN starting IV and giving meds at this time. Will continue efforts.   Royce MacadamiaLitaker, Ian Thornton 01/01/2017, 9:56 AM   Breck CoonsLisa Thornton Ian FaceLitaker M.Ed [employer] Pager [phone]

## 2017-01-01 NOTE — Progress Notes (Signed)
[facility]                                                                              Patient Demographics  Ian Thornton, is a 61 y.o. male, DOB - 1956-02-20, ZOX:[id number]  Admit date - 12/27/2016   Admitting Physician Hillary Bow, DO  Outpatient Primary MD for the patient is No primary care provider on file.  Outpatient specialists:   LOS - 5  days    Chief Complaint  Patient presents with  . Weakness       Brief summary   61 year old male with significant alcohol abuse heavy smoker who was brought to the ED by family with change in mental status for past few days with agitation and confusion. Family also noted visual hallucination. Patient apparently had stopped drinking last few days and reportedly also had seizures 2-3 days back. In the ED vitals were stable patient was confused, hallucinating and confabulating. Alcohol level was undetectable. Placed on CIWA. MRI of the brain done showing acute right basal ganglia infarct. The patient was admitted to the stepdown unit and stroke team was consulted.   Assessment & Plan     Principal Problem:   CVA (cerebral vascular accident) (HCC) - Acute right basal ganglia ischemic stroke. No residual weakness. May have some speech/attention deficiency which could be due to alcohol withdrawal. - Neurology was consulted. Patient was placed on aspirin. Recommend against DAPT due to thrombocytopenia.  - LDL of 87 with high triglycerides.  A1c 5.3. - PT recommends SNF. Needs outpatient speech therapy. - 2-D echo with normal EF and no wall motion abnormality, no cardiac source of emboli. - CT angiogram of the head and neck shows 90% stenosis of rt ICA and occluded left ICA..   - Vascular surgery was consulted as carotid Doppler showed right 80-99% ICA stenosis and occluded left ICA. - Recommend right CEA on 01/12/2017   Active Problems: Alcohol abuse/withdrawal with  DTs (delirium tremens) (HCC) - Out of alcohol  withdrawals, currently at baseline - Monitor on CIWA. Receiving  thiamine, folate and multivitamin.  - No signs of DTs at present but still has mild withdrawal.  - Counseled strongly on cessation.    Thrombocytopenia (HCC) - Possibly alcohol-induced marrow suppression. -  Slowly improving.    Alcohol withdrawal seizure without complication (HCC) - resolved - Seizure precautions.  - Monitor on CIWA. -  EEG negative for epilepsy.  No anticonvulsants needed.  Tobacco use - nicotine patch. Counseled on cessation.  Severe hypokalemia/hypomagnesemia - Replaced   transaminitis -Possibly alcohol induced hepatitis. Improving.  Left upper arm cellulitis - Likely the cause of fevers, placed on Ancef IV - UA, blood cultures negative so far, chest x-ray negative   Code Status: full code  DVT Prophylaxis:  SCD's Family Communication: Discussed in detail with the patient, all imaging results, lab results explained to the patient and sister in [place].   Disposition Plan: Discussed with patient's sister on the phone who requested that patient only be discharged to skilled nursing facility. She states that patient has a problem with substance abuse, alcohol and she does not want him to go  back to the same environment. Updated social work about the request from patient's sister.  Time Spent in minutes   15 minutes  Procedures:  Head CT MRI brain 2-D echo   CT angiogram head and  Carotid ultrasound  Consultants:   Neurology Vascular surgery  Antimicrobials:      Medications  Scheduled Meds: .  stroke: mapping our early stages of recovery book   Does not apply Once  . aspirin EC  325 mg Oral Daily   Or  . aspirin  300 mg Rectal Daily  . atorvastatin  40 mg Oral q1800  .  ceFAZolin (ANCEF) IV  2 g Intravenous Q8H  . feeding supplement (ENSURE ENLIVE)  237 mL Oral BID BM  . folic acid  1 mg Oral Daily  . multivitamin with minerals  1 tablet Oral Daily  . nicotine   21 mg Transdermal Daily  . thiamine  100 mg Oral Daily   Continuous Infusions: PRN Meds:.acetaminophen **OR** acetaminophen (TYLENOL) oral liquid 160 mg/5 mL **OR** acetaminophen, LORazepam   Antibiotics   Anti-infectives    Start     Dose/Rate Route Frequency Ordered Stop   01/01/17 1000  ceFAZolin (ANCEF) IVPB 2g/100 mL premix     2 g 200 mL/hr over 30 Minutes Intravenous Every 8 hours 01/01/17 0903     01/01/17 0000  doxycycline (VIBRAMYCIN) 100 MG capsule     100 mg Oral 2 times daily 01/01/17 1128          Subjective:   Ian Thornton was seen and examined today.  No significant complaints, much more alert and oriented today, no tremors. Patient denies dizziness, chest pain, shortness of breath, abdominal pain, N/V/D/C, new weakness, numbess, tingling. No acute events overnight.    Objective:   Vitals:   12/31/16 1722 12/31/16 2209 01/01/17 0208 01/01/17 0650  BP:  121/72 106/72 118/65  Pulse: 100 93 90 98  Resp:  20 20 18   Temp: 97.8 F (36.6 C) 99.4 F (37.4 C) 98.5 F (36.9 C) 99.5 F (37.5 C)  TempSrc: Oral Oral  Oral  SpO2:  99% 99% 100%  Weight:      Height:        Intake/Output Summary (Last 24 hours) at 01/01/17 1503 Last data filed at 12/31/16 1614  Gross per 24 hour  Intake               60 ml  Output               40 ml  Net               20 ml     Wt Readings from Last 3 Encounters:  12/28/16 62.7 kg (138 lb 3.2 oz)     Exam  General: Alert and oriented x 3, NAD3  HEENT:    Neck: Supple, no JVD  Cardiovascular: S1 S2 auscultated, no rubs, murmurs or gallops. Regular rate and rhythm.  Respiratory: Clear to auscultation bilaterally, no wheezing, rales or rhonchi  Gastrointestinal: Soft, nontender, nondistended, + bowel sounds  Ext: no cyanosis clubbing or edema,   Neuro: AAOx3, Cr N's II- XII. Strength 5/5 upper and lower extremities bilaterally  Skin: No rashes  Psych: Normal affect and demeanor, alert and oriented x  3   Data Reviewed:  I have personally reviewed following labs and imaging studies  Micro Results Recent Results (from the past 240 hour(s))  Culture, blood (Routine x 2)     Status: None (  Preliminary result)   Collection Time: 12/27/16  8:36 PM  Result Value Ref Range Status   Specimen Description BLOOD RIGHT ANTECUBITAL  Final   Special Requests IN PEDIATRIC BOTTLE 2CC  Final   Culture NO GROWTH 4 DAYS  Final   Report Status PENDING  Incomplete  Culture, blood (Routine x 2)     Status: None (Preliminary result)   Collection Time: 12/27/16  8:36 PM  Result Value Ref Range Status   Specimen Description BLOOD RIGHT HAND  Final   Special Requests BOTTLES DRAWN AEROBIC AND ANAEROBIC 5CC  Final   Culture NO GROWTH 4 DAYS  Final   Report Status PENDING  Incomplete  Urine culture     Status: Abnormal   Collection Time: 12/27/16  8:40 PM  Result Value Ref Range Status   Specimen Description URINE, RANDOM  Final   Special Requests NONE  Final   Culture MULTIPLE SPECIES PRESENT, SUGGEST RECOLLECTION (A)  Final   Report Status 12/29/2016 FINAL  Final  MRSA PCR Screening     Status: None   Collection Time: 12/28/16  3:02 AM  Result Value Ref Range Status   MRSA by PCR NEGATIVE NEGATIVE Final    Comment:        The GeneXpert MRSA Assay (FDA approved for NASAL specimens only), is one component of a comprehensive MRSA colonization surveillance program. It is not intended to diagnose MRSA infection nor to guide or monitor treatment for MRSA infections.   Urine culture     Status: Abnormal   Collection Time: 12/31/16  4:15 PM  Result Value Ref Range Status   Specimen Description URINE, RANDOM  Final   Special Requests NONE  Final   Culture MULTIPLE SPECIES PRESENT, SUGGEST RECOLLECTION (A)  Final   Report Status 01/01/2017 FINAL  Final    Radiology Reports Ct Angio Head W Or Wo Contrast  Result Date: 12/28/2016 CLINICAL DATA:  61 y/o M; weakness history of ETOH abuse currently  in delirium tremens. EXAM: CT ANGIOGRAPHY HEAD AND NECK TECHNIQUE: Multidetector CT imaging of the head and neck was performed using the standard protocol during bolus administration of intravenous contrast. Multiplanar CT image reconstructions and MIPs were obtained to evaluate the vascular anatomy. Carotid stenosis measurements (when applicable) are obtained utilizing NASCET criteria, using the distal internal carotid diameter as the denominator. CONTRAST:  50 cc Isovue 370 COMPARISON:  12/27/2016 MRI and MRA of the head. FINDINGS: CT HEAD FINDINGS Brain: Area of hypoattenuation within the right anterior basal ganglia involving caudate head and lentiform nucleus corresponding to diffusion signal abnormality on MRI consistent with evolving acute infarction. No acute hemorrhage or new area of infarction. No significant mass effect. No extra-axial collection or hydrocephalus. Stable chronic lacunar infarct within left caudate head, mild volume loss of the brain, and mild chronic microvascular ischemic changes. Vascular: As below. Skull: Normal. Negative for fracture or focal lesion. Sinuses: Imaged portions are clear. Orbits: No acute finding. Review of the MIP images confirms the above findings CTA NECK FINDINGS Aortic arch: Mild aortic atherosclerosis with arch calcification. Four vessel arch. Right carotid system: Dense calcified plaque of right carotid bifurcation with severe 90% stenosis of proximal right ICA. Areas of fibrofatty plaque of right common carotid artery without significant stenosis. Left carotid system: Patent left common carotid artery. Occlusion of left internal carotid artery from the bifurcation to the carotid terminus which is patent. Vertebral arteries: Codominant. No evidence of dissection, stenosis (50% or greater) or occlusion. Skeleton: Mild cervical spondylosis  greatest at the C6-7 level. No high-grade bony canal stenosis or foraminal narrowing. Other neck: Negative appear Upper chest: 4  mm right upper lobe and right lower lobe pulmonary nodules (series 601, image 38 and 5). Mild centrilobular emphysematous changes with a large right upper lobe bulla anterior to mediastinum. Review of the MIP images confirms the above findings CTA HEAD FINDINGS Moderate motion artifact. Suboptimal evaluation for subtle stenosis or small aneurysm. Anterior circulation: Calcific atherosclerosis of right cavernous and supraclinoid segments without high-grade stenosis. Occluded left internal carotid artery to the level of the terminus which is patent. Bilateral middle cerebral arteries, anterior cerebral arteries, and branches are patent. Otherwise no high-grade stenosis, proximal occlusion, aneurysm, or vascular malformation identified. Posterior circulation: No high-grade stenosis, proximal occlusion, aneurysm, or vascular malformation. Venous sinuses: As permitted by contrast timing, patent. Anatomic variants: Patent anterior communicating and bilateral posterior communicating arteries. Delayed phase: No abnormal intracranial enhancement. Review of the MIP images confirms the above findings IMPRESSION: 1. Right anterior basal ganglia acute infarction stable in size in comparison with prior MRI. No hemorrhage identified. 2. 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 This may represent sequelae of age indeterminate dissection or atherosclerotic disease. 3. Proximal right ICA severe 90% stenosis secondary to dense calcified plaque. 4. Patent vertebral arteries in the neck without high-grade stenosis. 5. Motion degraded CTA of the head. No high-grade stenosis, proximal occlusion, aneurysm, or vascular malformation of the anterior cerebral arteries, middle cerebral arteries, or posterior circulation identified. 6. 4 mm pulmonary nodules in the right upper and right lower lobe. No follow-up needed if patient is low-risk (and has no known or suspected primary  neoplasm). Non-contrast chest CT can be considered in 12 months if patient is high-risk. This recommendation follows the consensus statement: Guidelines for Management of Incidental Pulmonary Nodules Detected on CT Images: From the Fleischner Society 2017; Radiology 2017; 284:228-243. 7. Mild centrilobular emphysema. Electronically Signed   By: Mitzi Hansen M.D.   On: 12/28/2016 17:06   Dg Chest 2 View  Result Date: 12/31/2016 CLINICAL DATA:  History of recent stroke with ongoing fever. EXAM: CHEST  2 VIEW COMPARISON:  12/27/2016 FINDINGS: The cardiac silhouette is enlarged. Mediastinal contours appear intact. Calcific atherosclerotic disease of the aorta. There is no evidence of focal airspace consolidation, pleural effusion or pneumothorax. Osseous structures are without acute abnormality. Soft tissues are grossly normal. IMPRESSION: Enlarged cardiac silhouette. Calcific atherosclerotic disease of the aorta. No focal airspace consolidation. Electronically Signed   By: Ted Mcalpine M.D.   On: 12/31/2016 15:47   Dg Chest 2 View  Result Date: 12/27/2016 CLINICAL DATA:  Weakness and weight loss. EXAM: CHEST  2 VIEW COMPARISON:  Report from 08/09/2000 FINDINGS: The heart size and mediastinal contours are within normal limits. The lungs are mildly hyperinflated without pneumonic consolidation, CHF nor effusion. No pneumothorax. Bilateral nipple shadows are seen over the lower lobes. The visualized skeletal structures are unremarkable. IMPRESSION: No active cardiopulmonary disease. Mild hyperinflation of the lungs. Electronically Signed   By: Tollie Eth M.D.   On: 12/27/2016 19:27   Dg Abd 1 View  Result Date: 12/27/2016 CLINICAL DATA:  Pre MRI for foreign body in the intestine. EXAM: ABDOMEN - 1 VIEW COMPARISON:  None. FINDINGS: The bowel gas pattern is normal. No radio-opaque calculi nor foreign body identified. Osteophytes are seen along the dorsal spine. No acute osseous abnormality.  No free air. IMPRESSION: No metallic foreign body in the abdomen or  pelvis. Unremarkable bowel gas pattern. Electronically Signed   By: Tollie Eth M.D.   On: 12/27/2016 23:43   Ct Head Wo Contrast  Result Date: 12/27/2016 CLINICAL DATA:  Initial evaluation for acute weakness. EXAM: CT HEAD WITHOUT CONTRAST TECHNIQUE: Contiguous axial images were obtained from the base of the skull through the vertex without intravenous contrast. COMPARISON:  Prior CT from 05/25/2005. FINDINGS: Brain: Diffuse prominence of the CSF containing spaces is compatible with generalized cerebral atrophy. Patchy hypodensity within the periventricular and deep white matter of both cerebral hemispheres is most consistent with chronic microvascular ischemic disease. Small remote lacunar infarct within the left basal ganglia. There is evolving hypodensity involving the right basal ganglia, with involvement of the right caudate and anterior lenticular nucleus, compatible with acute ischemic infarct. No associated mass effect or hemorrhage. No other acute intracranial hemorrhage or infarct identified. No mass lesion, midline shift, or mass effect. No hydrocephalus. No extra-axial fluid collection. Vascular: No hyperdense vessel. Scattered vascular calcifications noted within the carotid siphons. Skull: Focal hyperdensity noted at the right frontal scalp, of doubtful clinical significance. Scalp soft tissues demonstrate no acute abnormality. Calvarium intact. Sinuses/Orbits: Globes and orbital soft tissues within normal limits. Paranasal sinuses and mastoid air cells are clear. IMPRESSION: 1. Evolving acute/early subacute ischemic right basal ganglia infarct. 2. Small remote lacunar infarct within the left basal ganglia. 3. Age-related cerebral atrophy with chronic microvascular ischemic disease. Electronically Signed   By: Rise Mu M.D.   On: 12/27/2016 21:51   Ct Angio Neck W Or Wo Contrast  Result Date: 12/28/2016 CLINICAL  DATA:  61 y/o M; weakness history of ETOH abuse currently in delirium tremens. EXAM: CT ANGIOGRAPHY HEAD AND NECK TECHNIQUE: Multidetector CT imaging of the head and neck was performed using the standard protocol during bolus administration of intravenous contrast. Multiplanar CT image reconstructions and MIPs were obtained to evaluate the vascular anatomy. Carotid stenosis measurements (when applicable) are obtained utilizing NASCET criteria, using the distal internal carotid diameter as the denominator. CONTRAST:  50 cc Isovue 370 COMPARISON:  12/27/2016 MRI and MRA of the head. FINDINGS: CT HEAD FINDINGS Brain: Area of hypoattenuation within the right anterior basal ganglia involving caudate head and lentiform nucleus corresponding to diffusion signal abnormality on MRI consistent with evolving acute infarction. No acute hemorrhage or new area of infarction. No significant mass effect. No extra-axial collection or hydrocephalus. Stable chronic lacunar infarct within left caudate head, mild volume loss of the brain, and mild chronic microvascular ischemic changes. Vascular: As below. Skull: Normal. Negative for fracture or focal lesion. Sinuses: Imaged portions are clear. Orbits: No acute finding. Review of the MIP images confirms the above findings CTA NECK FINDINGS Aortic arch: Mild aortic atherosclerosis with arch calcification. Four vessel arch. Right carotid system: Dense calcified plaque of right carotid bifurcation with severe 90% stenosis of proximal right ICA. Areas of fibrofatty plaque of right common carotid artery without significant stenosis. Left carotid system: Patent left common carotid artery. Occlusion of left internal carotid artery from the bifurcation to the carotid terminus which is patent. Vertebral arteries: Codominant. No evidence of dissection, stenosis (50% or greater) or occlusion. Skeleton: Mild cervical spondylosis greatest at the C6-7 level. No high-grade bony canal stenosis or  foraminal narrowing. Other neck: Negative appear Upper chest: 4 mm right upper lobe and right lower lobe pulmonary nodules (series 601, image 38 and 5). Mild centrilobular emphysematous changes with a large right upper lobe bulla anterior to mediastinum. Review of the MIP images confirms  the above findings CTA HEAD FINDINGS Moderate motion artifact. Suboptimal evaluation for subtle stenosis or small aneurysm. Anterior circulation: Calcific atherosclerosis of right cavernous and supraclinoid segments without high-grade stenosis. Occluded left internal carotid artery to the level of the terminus which is patent. Bilateral middle cerebral arteries, anterior cerebral arteries, and branches are patent. Otherwise no high-grade stenosis, proximal occlusion, aneurysm, or vascular malformation identified. Posterior circulation: No high-grade stenosis, proximal occlusion, aneurysm, or vascular malformation. Venous sinuses: As permitted by contrast timing, patent. Anatomic variants: Patent anterior communicating and bilateral posterior communicating arteries. Delayed phase: No abnormal intracranial enhancement. Review of the MIP images confirms the above findings IMPRESSION: 1. Right anterior basal ganglia acute infarction stable in size in comparison with prior MRI. No hemorrhage identified. 2. 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 This may represent sequelae of age indeterminate dissection or atherosclerotic disease. 3. Proximal right ICA severe 90% stenosis secondary to dense calcified plaque. 4. Patent vertebral arteries in the neck without high-grade stenosis. 5. Motion degraded CTA of the head. No high-grade stenosis, proximal occlusion, aneurysm, or vascular malformation of the anterior cerebral arteries, middle cerebral arteries, or posterior circulation identified. 6. 4 mm pulmonary nodules in the right upper and right lower lobe. No follow-up  needed if patient is low-risk (and has no known or suspected primary neoplasm). Non-contrast chest CT can be considered in 12 months if patient is high-risk. This recommendation follows the consensus statement: Guidelines for Management of Incidental Pulmonary Nodules Detected on CT Images: From the Fleischner Society 2017; Radiology 2017; 284:228-243. 7. Mild centrilobular emphysema. Electronically Signed   By: Mitzi Hansen M.D.   On: 12/28/2016 17:06   Mr Maxine Glenn Head Wo Contrast  Result Date: 12/28/2016 CLINICAL DATA:  Initial evaluation for acute stroke. EXAM: MRI HEAD WITHOUT CONTRAST MRA HEAD WITHOUT CONTRAST TECHNIQUE: Multiplanar, multiecho pulse sequences of the brain and surrounding structures were obtained without intravenous contrast. Angiographic images of the head were obtained using MRA technique without contrast. COMPARISON:  Prior CT from earlier the same day. FINDINGS: MRI HEAD FINDINGS Brain: Study degraded by motion artifact. Mild diffuse prominence of the CSF containing spaces is compatible with generalized age-related cerebral atrophy. Patchy T2/FLAIR hyperintensity within the periventricular and deep white matter both cerebral hemispheres most compatible chronic microvascular ischemic disease, mild to moderate in nature. Abnormal restricted diffusion involves the right basal ganglia, specifically involving the anterior right lentiform nucleus and right caudate head. Involvement of the anterior limb of the right internal capsule. Associated T2/FLAIR signal abnormality without significant mass effect. This is largely acute in appearance on this exam. No associated hemorrhage. No other evidence for acute ischemia. No mass lesion, midline shift or mass effect. No hydrocephalus. No extra-axial fluid collection. Major dural sinuses are grossly patent. No intrinsic temporal lobe abnormality. Pituitary gland and suprasellar region within normal limits. Vascular: Abnormal flow void within  the left ICA to the terminus. Major intracranial vascular flow voids otherwise maintained. Skull and upper cervical spine: Craniocervical junction normal. Visualized upper cervical spine unremarkable. Bone marrow signal intensity within normal limits. No scalp soft tissue abnormality. Sinuses/Orbits: Globes and orbital soft tissues within normal limits. Scattered mucosal thickening within the ethmoidal air cells and maxillary sinuses. No air-fluid level to suggest active sinus infection. No mastoid effusion. Inner ear structures grossly normal. MRA HEAD FINDINGS ANTERIOR CIRCULATION: Study degraded by motion artifact. Left ICA is occluded to the level of the terminus. Distal cervical right ICA widely patent with  antegrade flow. Petrous, cavernous, and supraclinoid right ICA patent without high-grade stenosis. Right A1 segment dominant and widely patent. There is a patent hypoplastic left A1 segment. Anterior communicating artery normal. Anterior cerebral arteries patent to their distal aspects with suspected multifocal atheromatous irregularity. Left A1 segment patent. There is an apparent short-segment severe proximal left M2 stenosis (series 13, image 37). Small vessel atheromatous irregularity distally within the left MCA branches. Short-segment moderate stenosis within the distal right M1 segment (series 13, image 71). Distal small vessel atheromatous irregularity within the right MCA branches. POSTERIOR CIRCULATION: Vertebral arteries code dominant and patent to the vertebrobasilar junction. Posterior inferior cerebral arteries not well evaluated on this motion degraded study. Basilar artery mildly tortuous but widely patent to its distal aspect. Superior cerebral arteries patent bilaterally. Posterior cerebral arteries largely supplied via the basilar artery and are widely patent to their distal aspects. Small bilateral posterior communicating arteries noted. No aneurysm or vascular malformation. IMPRESSION:  MRI HEAD IMPRESSION: 1. 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 No associated hemorrhage or significant mass effect. 2. 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 MRA HEAD IMPRESSION: 1. 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 2. Widely patent right carotid artery system. 3. 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 4. Widely patent vertebrobasilar system. Electronically Signed   By: Rise Mu M.D.   On: 12/28/2016 01:30   Mr Brain Wo Contrast (neuro Protocol)  Result Date: 12/28/2016 CLINICAL DATA:  Initial evaluation for acute stroke. EXAM: MRI HEAD WITHOUT CONTRAST MRA HEAD WITHOUT CONTRAST TECHNIQUE: Multiplanar, multiecho pulse sequences of the brain and surrounding structures were obtained without intravenous contrast. Angiographic images of the head were obtained using MRA technique without contrast. COMPARISON:  Prior CT from earlier the same day. FINDINGS: MRI HEAD FINDINGS Brain: Study degraded by motion artifact. Mild diffuse prominence of the CSF containing spaces is compatible with generalized age-related cerebral atrophy. Patchy T2/FLAIR hyperintensity within the periventricular and deep white matter both cerebral hemispheres most compatible chronic microvascular ischemic disease, mild to moderate in nature. Abnormal restricted diffusion involves the right basal ganglia, specifically involving the anterior right lentiform nucleus and right caudate head. Involvement of the anterior limb of the right internal capsule. Associated T2/FLAIR signal abnormality without significant mass effect. This is largely acute in appearance on this exam. No associated hemorrhage. No other evidence for acute ischemia. No mass lesion, midline shift or mass effect. No hydrocephalus. No extra-axial fluid collection. Major dural sinuses are grossly patent. No intrinsic temporal lobe abnormality. Pituitary gland and  suprasellar region within normal limits. Vascular: Abnormal flow void within the left ICA to the terminus. Major intracranial vascular flow voids otherwise maintained. Skull and upper cervical spine: Craniocervical junction normal. Visualized upper cervical spine unremarkable. Bone marrow signal intensity within normal limits. No scalp soft tissue abnormality. Sinuses/Orbits: Globes and orbital soft tissues within normal limits. Scattered mucosal thickening within the ethmoidal air cells and maxillary sinuses. No air-fluid level to suggest active sinus infection. No mastoid effusion. Inner ear structures grossly normal. MRA HEAD FINDINGS ANTERIOR CIRCULATION: Study degraded by motion artifact. Left ICA is occluded to the level of the terminus. Distal cervical right ICA widely patent with antegrade flow. Petrous, cavernous, and supraclinoid right ICA patent without high-grade stenosis. Right A1 segment dominant and widely patent. There is a patent hypoplastic left A1 segment. Anterior communicating artery normal. Anterior cerebral arteries patent to their distal aspects with suspected multifocal atheromatous irregularity. Left A1  segment patent. There is an apparent short-segment severe proximal left M2 stenosis (series 13, image 37). Small vessel atheromatous irregularity distally within the left MCA branches. Short-segment moderate stenosis within the distal right M1 segment (series 13, image 71). Distal small vessel atheromatous irregularity within the right MCA branches. POSTERIOR CIRCULATION: Vertebral arteries code dominant and patent to the vertebrobasilar junction. Posterior inferior cerebral arteries not well evaluated on this motion degraded study. Basilar artery mildly tortuous but widely patent to its distal aspect. Superior cerebral arteries patent bilaterally. Posterior cerebral arteries largely supplied via the basilar artery and are widely patent to their distal aspects. Small bilateral posterior  communicating arteries noted. No aneurysm or vascular malformation. IMPRESSION: MRI HEAD IMPRESSION: 1. 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 No associated hemorrhage or significant mass effect. 2. 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 MRA HEAD IMPRESSION: 1. 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 2. Widely patent right carotid artery system. 3. 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 4. Widely patent vertebrobasilar system. Electronically Signed   By: Rise Mu M.D.   On: 12/28/2016 01:30    Lab Data:  CBC:  Recent Labs Lab 12/27/16 1900 12/29/16 0418 12/30/16 0557 01/01/17 0430  WBC 8.8 5.8 7.7 9.0  NEUTROABS 6.7  --   --   --   HGB 13.5 11.4* 11.2* 12.1*  HCT 40.5 34.3* 34.2* 36.9*  MCV 101.5* 100.3* 102.4* 101.9*  PLT 84* 76* 99* 174   Basic Metabolic Panel:  Recent Labs Lab 12/28/16 0139 12/28/16 0154 12/28/16 1234 12/29/16 0418 12/29/16 0923 12/30/16 0557 12/30/16 0916 01/01/17 0430  NA  --  134* 134* 135  --  136  --  132*  K  --  2.8* 3.0* 2.9*  --  3.8  --  3.8  CL  --  100* 104 108  --  107  --  101  CO2  --  17* 18* 21*  --  24  --  23  GLUCOSE  --  75 70 96  --  104*  --  90  BUN  --  15 10 <5*  --  <5*  --  8  CREATININE  --  1.32* 0.85 0.55*  --  0.61  --  0.84  CALCIUM  --  8.0* 8.2* 7.9*  --  8.5*  --  9.4  MG 1.6*  --   --   --  2.2  --  1.8 1.9   GFR: Estimated Creatinine Clearance: 85 mL/min (by C-G formula based on SCr of 0.84 mg/dL). Liver Function Tests:  Recent Labs Lab 12/27/16 1900 12/29/16 0418 12/30/16 0557  AST 198* 123* 80*  ALT 45 38 35  ALKPHOS 197* 112 113  BILITOT 1.8* 1.1 1.2  PROT 8.2* 6.6 6.7  ALBUMIN 3.6 2.8* 2.9*   No results for input(s): LIPASE, AMYLASE in the last 168 hours. No results for input(s): AMMONIA in the last 168 hours. Coagulation Profile:  Recent Labs Lab 12/27/16 1900  INR 1.10    Cardiac Enzymes: No results for input(s): CKTOTAL, CKMB, CKMBINDEX, TROPONINI in the last 168 hours. BNP (last 3 results) No results for input(s): PROBNP in the last 8760 hours. HbA1C: No results for input(s): HGBA1C in the last 72 hours. CBG: No results for input(s): GLUCAP in the last 168 hours. Lipid Profile: No results for input(s): CHOL, HDL, LDLCALC, TRIG, CHOLHDL, LDLDIRECT in the last 72 hours. Thyroid Function Tests: No results  for input(s): TSH, T4TOTAL, FREET4, T3FREE, THYROIDAB in the last 72 hours. Anemia Panel: No results for input(s): VITAMINB12, FOLATE, FERRITIN, TIBC, IRON, RETICCTPCT in the last 72 hours. Urine analysis:    Component Value Date/Time   COLORURINE AMBER (A) 12/31/2016 1616   APPEARANCEUR CLEAR 12/31/2016 1616   LABSPEC 1.013 12/31/2016 1616   PHURINE 7.0 12/31/2016 1616   GLUCOSEU NEGATIVE 12/31/2016 1616   HGBUR NEGATIVE 12/31/2016 1616   BILIRUBINUR NEGATIVE 12/31/2016 1616   KETONESUR NEGATIVE 12/31/2016 1616   PROTEINUR NEGATIVE 12/31/2016 1616   NITRITE NEGATIVE 12/31/2016 1616   LEUKOCYTESUR NEGATIVE 12/31/2016 1616     Tery Hoeger M.D. [facility] 01/01/2017, 3:03 PM  Pager: [phone] Between 7am to 7pm - call Pager - [phone]  After 7pm go to [email or web address] - password [email or web address]  Call night coverage person covering after 7pm

## 2017-01-01 NOTE — Clinical Social Work Note (Signed)
Clinical Social Work Assessment  Patient Details  Name: Ian FeathersBernard Vassey MRN: [id number] Date of Birth: 07/19/1956  Date of referral:  01/01/17               Reason for consult:  Facility Placement                Permission sought to share information with:  Facility Medical sales representativeContact Representative, Family Supports Permission granted to share information::  Yes, Verbal Permission Granted  Name::     CuratorLaurie  Agency::  SNFs  Relationship::  Sister  Contact Information:     Housing/Transportation Living arrangements for the past 2 months:  Single Family Home Source of Information:  Other (Comment Required) (Siblings) Patient Interpreter Needed:  None Criminal Activity/Legal Involvement Pertinent to Current Situation/Hospitalization:  No - Comment as needed Significant Relationships:  Siblings Lives with:  Siblings Do you feel safe going back to the place where you live?  No Need for family participation in patient care:  Yes (Comment)  Care giving concerns:  CSW received consult for possible SNF placement at time of discharge. Patient is disoriented. CSW spoke with patient's sisters regarding recommendation. Patient lives with his brother and is not working. Family would like for patient to go to alcohol rehab. CSW explained that we are not able to place patients into rehab straight from the hospital, but can provide resources. They asked CSW to pursue SNF placement. [facility]. If no answer received or bed found by date of discharge (only 4 [facility] SNF facilities), patient will have to discharge home with family with the possibility of being placed from home. CSW to continue to follow and assist with discharge planning needs.   Social Worker assessment / plan:  CSW looking into SNF placement.   Employment status:  Unemployed Health and safety inspectornsurance information:  VA Benefit PT Recommendations:  Skilled Nursing Facility Information / Referral to community resources:  Skilled Nursing  Facility  Patient/Family's Response to care:  Patient's sisters helped fill out [facility] paperwork to see if pt can dc to SNF.  Patient/Family's Understanding of and Emotional Response to Diagnosis, Current Treatment, and Prognosis:  Patient/family is realistic regarding therapy needs and expressed being hopeful for SNF placement. Patient expressed understanding of CSW role and discharge process. No questions/concerns about plan or treatment.    Emotional Assessment Appearance:  Appears stated age Attitude/Demeanor/Rapport:  Unable to Assess Affect (typically observed):  Unable to Assess Orientation:  Oriented to Self, Oriented to Place, Oriented to  Time Alcohol / Substance use:  Alcohol Use Psych involvement (Current and /or in the community):  No (Comment)  Discharge Needs  Concerns to be addressed:  Care Coordination Readmission within the last 30 days:  No Current discharge risk:  None Barriers to Discharge:  Continued Medical Work up   [employer] S Ranveer Wahlstrom, LCSWA 01/01/2017, 1:00 PM

## 2017-01-01 NOTE — Progress Notes (Signed)
Late entry for 01/01/17 @ 0500.  After inserting new IV and helping patient get  Adjusted in his bed, I noticed a reddened and warm area on upper left arm.  Marked area with marker and gave report to oncoming nurse.

## 2017-01-01 NOTE — Progress Notes (Addendum)
Physical Therapy Treatment Patient Details Name: Ian Thornton MRN: [id number] DOB: December 17, 1955 Today's Date: 01/01/2017    History of Present Illness Aviyon Hocevar an 61 y.o.malewith a history of alcohol abuse brought to the ED by family members on 12/27/16 because of a change in mental status with agitation and confusion. They also noticed visual hallucinations. It's unclear when patient last drank alcohol. He reportedly had a seizure 2-3 days ago. No medical attention was sought. Alcohol level in ED was undetectable. CT scan of his head showed a low density right basal ganglial lesion indicative of acute possible subacute ischemic stroke. It's unclear when he was last known normal. No focal weakness, facial droop has been noted by family.    PT Comments    Patient did very well today. Cognitively clear and appropriate (see below for details). He scored 55/56 on Berg Balance Assessment and 23/24 on Dynamic Gait Index. High level balance tasks completed and he did not appear unsteady or have any loss of balance. Discussed discharge plan with patient and he wants to go home. From a PT perspective he is clearly moving well enough to go home. Discussed with Programme researcher, broadcasting/film/video. Paged MD and awaiting return call.    Follow Up Recommendations  No PT follow up     Equipment Recommendations  None recommended by PT    Recommendations for Other Services       Precautions / Restrictions Precautions Precautions: None    Mobility  Bed Mobility Overal bed mobility: Independent                Transfers Overall transfer level: Independent Equipment used: None                Ambulation/Gait Ambulation/Gait assistance: Independent Ambulation Distance (Feet): 400 Feet Assistive device: None Gait Pattern/deviations: WFL(Within Functional Limits) Gait velocity: normal   General Gait Details: see Dynamic Gait Index   Stairs            Wheelchair Mobility    Modified  Rankin (Stroke Patients Only)       Balance Overall balance assessment: Independent                                  Cognition Arousal/Alertness: Awake/alert Behavior During Therapy: WFL for tasks assessed/performed                   General Comments: No impulsivity, following all commands briskly. Able to correctly state steps he would take to make a meal and if there was a fire in his kitchen.     Exercises      General Comments        Pertinent Vitals/Pain Pain Assessment: No/denies pain Faces Pain Scale: Hurts a little bit Pain Intervention(s): Monitored during session    Home Living                      Prior Function            PT Goals (current goals can now be found in the care plan section) Acute Rehab PT Goals Patient Stated Goal: wants to go home PT Goal Formulation: With patient Time For Goal Achievement: 01/08/17 Potential to Achieve Goals: Good Progress towards PT goals: Goals met and updated - see care plan    Frequency    Min 2X/week      PT Plan Discharge plan needs to  be updated    Co-evaluation             End of Session Equipment Utilized During Treatment: Gait belt Activity Tolerance: Patient tolerated treatment well;Treatment limited secondary to agitation Patient left: in bed;with call bell/phone within reach;with bed alarm set     Time: 6116-4353 PT Time Calculation (min) (ACUTE ONLY): 18 min  Charges:  $Gait Training: 8-22 mins                    G Codes:      [employer], PT 01-05-2017, 4:10 PM

## 2017-01-01 NOTE — Progress Notes (Signed)
[facility] day [facility] SN[facility] stay. CSW has faxed info and left voicemails for facilities to review referral. Facility options are:  1.  [facility] [id number] [place], [place] [address] P [phone] F [phone] Admissions: Lanora Manishonda McCorkle 2. [facility] [address] [place], [place] [address] P [phone] Admissions: Renna (Re) Earl LitesGregory 3. [facility]  [address] [place], [place] [address] P [phone] Admissions: U[facility] [place] 4. [facility] [address] [place], [place] [address] Demetrius Charity [phone] F [phone] Admissions: Wilkie AyeKristy Little and Sidonie DickensDebra Simmons  Aidden Markovic LCSWA [phone]

## 2017-01-01 NOTE — Progress Notes (Signed)
Speech Language Pathology Treatment: Cognitive-Linquistic  Patient Details Name: Ian Thornton MRN: [id number] DOB: 1956-05-30 Today's Date: 01/01/2017 Time: [id number] SLP Time Calculation (min) (ACUTE ONLY): 12 min  Assessment / Plan / Recommendation Clinical Impression  Pt seen for cognitive treatment. He continues to exhibit decreased intellectual and anticipatory awareness of deficits and needs. Pt educated re: the need for further therapy to increase safety and effectiveness during daily activities. Pt provided verbal education for memory strategies with examples. Continue ST to facilitate cognitive function.    HPI HPI: Pt is a [age] y.o.malewith a history of alcohol abuse brought to the ED by family members on 12/27/16 because of a change in mental status with agitation and confusion. They also noticed visual hallucinations. It's unclear when patient last drank alcohol. He reportedly had a seizure 2-3 days ago. No medical attention was sought. Alcohol level in ED was undetectable. CT scan of his head showed a low density right basal ganglial lesion indicative of acute possible subacute ischemic stroke. It's unclear when he was last known normal. No focal weakness, facial droop has been noted by family.      SLP Plan  Continue with current plan of care     Recommendations                   Oral Care Recommendations: Oral care BID Follow up Recommendations: 24 hour supervision/assistance Plan: Continue with current plan of care       GO                Royce MacadamiaLitaker, Daisy Lites Willis 01/01/2017, 4:00 PM  Breck CoonsLisa Willis Lonell FaceLitaker M.Ed [employer]-SLP Pager [phone]

## 2017-01-01 NOTE — [place] FL2 (Signed)
[place] MEDICAID FL2 LEVEL OF CARE SCREENING TOOL     IDENTIFICATION  Patient Name: Ian Thornton Birthdate: 07-06-1956 Sex: male Admission Date (Current Location): 12/27/2016  [facility] and [place] Number:  Producer, television/film/video and Address:  [facility] [place], [address], [place], [place] [address]      Provider Number: [id number]  Attending Physician Name and Address:  Cathren Harsh, MD  Relative Name and Phone Number:       Current Level of Care: Hospital Recommended Level of Care: Skilled Nursing Facility Prior Approval Number:    Date Approved/Denied:   PASRR Number: [id number]  Discharge Plan: SNF    Current Diagnoses: Patient Active Problem List   Diagnosis Date Noted  . Cellulitis 01/01/2017  . CVA (cerebral vascular accident) (HCC) 12/28/2016  . ETOH abuse 12/28/2016  . DTs (delirium tremens) (HCC) 12/28/2016  . Thrombocytopenia (HCC) 12/28/2016  . Alcohol withdrawal seizure without complication (HCC)   . Hyperlipidemia   . Smoker   . Acute ischemic left MCA stroke (HCC)   . Hypokalemia   . Hypomagnesemia   . Dysarthria 12/27/2016    Orientation RESPIRATION BLADDER Height & Weight     Self, Place, Time  Normal Continent Weight: 62.7 kg (138 lb 3.2 oz) Height:  5\' 10"  (177.8 cm)  BEHAVIORAL SYMPTOMS/MOOD NEUROLOGICAL BOWEL NUTRITION STATUS      Continent Diet (Please see DC Summary)  AMBULATORY STATUS COMMUNICATION OF NEEDS Skin   Independent Verbally Normal                       Personal Care Assistance Level of Assistance  Bathing, Feeding, Dressing Bathing Assistance: Independent Feeding assistance: Independent Dressing Assistance: Independent     Functional Limitations Info             SPECIAL CARE FACTORS FREQUENCY  PT (By licensed PT)     PT Frequency: 3x/week              Contractures      Additional Factors Info  Code Status, Allergies Code Status Info: Full Allergies Info: NKA            Current Medications (01/01/2017):  This is the current hospital active medication list Current Facility-Administered Medications  Medication Dose Route Frequency Provider Last Rate Last Dose  .  stroke: mapping our early stages of recovery book   Does not apply Once Hillary Bow, DO      . acetaminophen (TYLENOL) tablet 650 mg  650 mg Oral Q4H PRN Hillary Bow, DO   650 mg at 12/31/16 1610   Or  . acetaminophen (TYLENOL) solution 650 mg  650 mg Per Tube Q4H PRN Hillary Bow, DO       Or  . acetaminophen (TYLENOL) suppository 650 mg  650 mg Rectal Q4H PRN Hillary Bow, DO      . aspirin EC tablet 325 mg  325 mg Oral Daily Marvel Plan, MD   325 mg at 01/01/17 5621   Or  . aspirin suppository 300 mg  300 mg Rectal Daily Marvel Plan, MD      . atorvastatin (LIPITOR) tablet 40 mg  40 mg Oral q1800 Marvel Plan, MD   40 mg at 12/31/16 1803  . ceFAZolin (ANCEF) IVPB 2g/100 mL premix  2 g Intravenous Q8H Ripudeep K Rai, MD   2 g at 01/01/17 0956  . feeding supplement (ENSURE ENLIVE) (ENSURE ENLIVE) liquid 237 mL  237 mL Oral BID BM Nishant Dhungel, MD   237 mL at 01/01/17 1000  . folic acid (FOLVITE) tablet 1 mg  1 mg Oral Daily Hillary BowJared M Gardner, DO   1 mg at 01/01/17 0946  . LORazepam (ATIVAN) injection 2-3 mg  2-3 mg Intravenous Q1H PRN Alexis Hugelmeyer, DO   2 mg at 12/31/16 1014  . multivitamin with minerals tablet 1 tablet  1 tablet Oral Daily Hillary BowJared M Gardner, DO   1 tablet at 01/01/17 0945  . nicotine (NICODERM CQ - dosed in mg/24 hours) patch 21 mg  21 mg Transdermal Daily Nishant Dhungel, MD   21 mg at 01/01/17 0946  . thiamine (VITAMIN B-1) tablet 100 mg  100 mg Oral Daily Nishant Dhungel, MD   100 mg at 01/01/17 0945     Discharge Medications: Please see discharge summary for a list of discharge medications.  Relevant Imaging Results:  Relevant Lab Results:   Additional Information SSN: [id number] 2091  Renne CriglerNadia S PortageRayyan, [place]CSWA

## 2017-01-01 NOTE — Progress Notes (Signed)
CSW alerted by PT that patient no longer needs PT. CSW spoke with patient, who confirmed that he wanted to return home at discharge. CSW updated patient's sisters.   CSW signing off.   Ian Thornton LCSWA [phone]

## 2017-01-02 NOTE — Progress Notes (Signed)
Occupational Therapy Treatment Patient Details Name: Ian Thornton MRN: [id number] DOB: 05/29/1956 Today's Date: 01/02/2017    History of present illness Ian Thornton an 61 y.o.malewith a history of alcohol abuse brought to the ED by family members on 12/27/16 because of a change in mental status with agitation and confusion. They also noticed visual hallucinations. It's unclear when patient last drank alcohol. He reportedly had a seizure 2-3 days ago. No medical attention was sought. Alcohol level in ED was undetectable. CT scan of his head showed a low density right basal ganglial lesion indicative of acute possible subacute ischemic stroke. It's unclear when he was last known normal. No focal weakness, facial droop has been noted by family.   OT comments  Pt able to complete ADL and functional mobility at independent level today. Updated d/c plan to home without OT follow up. All OT goals have been met and pt is ready for d/c from acute OT services. Please re-consult if needs change.    Follow Up Recommendations  No OT follow up    Equipment Recommendations  None recommended by OT    Recommendations for Other Services      Precautions / Restrictions Precautions Precautions: None Restrictions Weight Bearing Restrictions: No       Mobility Bed Mobility Overal bed mobility: Independent                Transfers Overall transfer level: Independent Equipment used: None                  Balance Overall balance assessment: Independent                                 ADL Overall ADL's : Independent                                       General ADL Comments: Pt able to perform LB dressing, toilet transfer, grooming in standing and tub transfer at independent level. Pt without unsteadiness or LOB.      Vision                     Perception     Praxis      Cognition   Behavior During Therapy: WFL for tasks  assessed/performed Overall Cognitive Status: Within Functional Limits for tasks assessed                       Extremity/Trunk Assessment               Exercises     Shoulder Instructions       General Comments      Pertinent Vitals/ Pain       Pain Assessment: No/denies pain  Home Living                                          Prior Functioning/Environment              Frequency           Progress Toward Goals  OT Goals(current goals can now be found in the care plan section)  Progress towards OT goals: Goals met/education completed, patient discharged from OT  Acute Rehab OT  Goals Patient Stated Goal: home today OT Goal Formulation: All assessment and education complete, DC therapy  Plan All goals met and education completed, patient discharged from OT services;Discharge plan needs to be updated    Co-evaluation                 End of Session     Activity Tolerance Patient tolerated treatment well   Patient Left in chair;with call bell/phone within reach;with chair alarm set   Nurse Communication          Time: 6389-3734 OT Time Calculation (min): 9 min  Charges: OT General Charges $OT Visit: 1 Procedure OT Treatments $Self Care/Home Management : 8-22 mins  Binnie Kand M.S., OTR/L Pager: [phone]  01/02/2017, 1:32 PM

## 2017-01-02 NOTE — Care Management Note (Signed)
Case Management Note  Patient Details  Name: Ian Thornton MRN: [id number] Date of Birth: Apr 17, 1956  Subjective/Objective:                    Action/Plan: Plan is to d/c to home today assuming care for self with help from brother if needed. Resides with brother. Pt is scheduled for Right carotid endarterectomy on 01/12/17. Pt is to [facility].  Expected Discharge Date:  01/02/17               Expected Discharge Plan:  Home/Self Care  In-House Referral:  Clinical Social Work  Discharge planning Services  CM Consult  Status of Service:  Completed, signed off  If discussed at [employer] Length [employer] Stay Meetings, dates discussed:    Additional Comments:  Epifanio LeschesCole, Nisha Dhami Hudson, RN 01/02/2017, 1:13 PM

## 2017-01-02 NOTE — Discharge Summary (Signed)
Physician Discharge Summary   Patient ID: Ian Thornton MRN: [id number] DOB/AGE: May 23, 1956 61 y.o.  Admit date: 12/27/2016 Discharge date: 01/02/2017  Primary Care Physician:  No primary care provider on file.  Discharge Diagnoses:    . Dysarthria . acute CVA (cerebral vascular accident) (HCC) . ETOH abuse . DTs (delirium tremens) (HCC) . Thrombocytopenia (HCC) . CellulitisOf the left upper arm   Consults:   Neurology Vascular surgery  Recommendations for Outpatient Follow-up:  1. Right carotid endarterectomy scheduled on 01/12/17 2. Please repeat CBC/BMET at next visit   DIET: Heart healthy diet    Allergies:  No Known Allergies   DISCHARGE MEDICATIONS: Current Discharge Medication List    START taking these medications   Details  aspirin EC 325 MG EC tablet Take 1 tablet (325 mg total) by mouth daily. Qty: 30 tablet, Refills: 5    atorvastatin (LIPITOR) 40 MG tablet Take 1 tablet (40 mg total) by mouth at bedtime. Qty: 30 tablet, Refills: 3    doxycycline (VIBRAMYCIN) 100 MG capsule Take 1 capsule (100 mg total) by mouth 2 (two) times daily. X 7 days Qty: 14 capsule, Refills: 0    folic acid (FOLVITE) 1 MG tablet Take 1 tablet (1 mg total) by mouth daily. Qty: 30 tablet, Refills: 3    Multiple Vitamin (MULTIVITAMIN WITH MINERALS) TABS tablet Take 1 tablet by mouth daily. Qty: 30 tablet, Refills: 3         Brief H and P: For complete details please refer to admission H and P, but in brief 61 year old male with significant alcohol abuse heavy smoker who was brought to the ED by family with change in mental status for past few days with agitation and confusion. Family also noted visual hallucination. Patient apparently had stopped drinking last few days and reportedly also had seizures 2-3 days back. In the ED vitals were stable patient was confused, hallucinating and confabulating. Alcohol level was undetectable. Placed on CIWA. MRI of the brain done showing  acute right basal ganglia infarct. The patient was admitted to the stepdown unit and stroke team was consulted.  Hospital Course:  CVA (cerebral vascular accident) (HCC) - Acute right basal ganglia ischemic stroke. No residual weakness. May have some speech/attention deficiency which could be due to alcohol withdrawal. - Neurology was consulted. Patient was placed on aspirin. Recommend against DAPT due to thrombocytopenia.  - LDL of 87 with high triglycerides. A1c 5.3. - PT initially recommended skilled nursing facility while patient was going through DTs. However at the time of discharge, patient is ambulatory and at his baseline. He refused skilled nursing facility. - 2-D echo with normal EF and no wall motion abnormality, no cardiac source of emboli. - CT angiogram of the head and neck shows 90% stenosis of rt ICAand occluded left ICA..  - Vascular surgery was consulted as carotid Doppler showed right 80-99% ICA stenosis and occluded left ICA. - Recommend right CEA on 01/12/2017   Alcohol abuse/withdrawal with DTs (delirium tremens) (HCC) - Out of alcohol withdrawals, currently at baseline.  - The patient was placed on CIWA protocol with Ativan, received thiamine, folate, multivitamin - Counseled strongly on cessation. - Currently back to his baseline. PT evaluation was done and patient is ambulatory without any assistance. He refused skilled nursing facility, currently oriented 3.  Thrombocytopenia (HCC) - Possibly alcohol-induced marrow suppression. - Slowly improving. Platelets 174,000 at discharge  Alcohol withdrawal seizure without complication (HCC) - resolved - Seizure precautions.  -  EEG negative for epilepsy.  No anticonvulsants needed.  Tobacco use - nicotine patch. Counseled on cessation.  Severe hypokalemia/hypomagnesemia - Replaced  transaminitis -Possibly alcohol induced hepatitis. Improving.  Left upper arm cellulitis - Likely the cause of  fevers, placed on Ancef IV while inpatient, doxycycline at the time of discharge.  - UA, blood cultures negative so far, chest x-ray negative   Day of Discharge BP 133/65 (BP Location: Right Arm)   Pulse 79   Temp 98.5 F (36.9 C) (Oral)   Resp 16   Ht 5\' 10"  (1.778 m)   Wt 62.7 kg (138 lb 3.2 oz)   SpO2 99%   BMI 19.83 kg/m   Physical Exam: General: Alert and awake oriented x3 not in any acute distress. HEENT: anicteric sclera, pupils reactive to light and accommodation CVS: S1-S2 clear no murmur rubs or gallops Chest: clear to auscultation bilaterally, no wheezing rales or rhonchi Abdomen: soft nontender, nondistended, normal bowel sounds Extremities: no cyanosis, clubbing or edema noted bilaterally Neuro: Cranial nerves II-XII intact, no focal neurological deficits   The results of significant diagnostics from this hospitalization (including imaging, microbiology, ancillary and laboratory) are listed below for reference.    LAB RESULTS: Basic Metabolic Panel:  Recent Labs Lab 12/30/16 0557  01/01/17 0430  NA 136  --  132*  K 3.8  --  3.8  CL 107  --  101  CO2 24  --  23  GLUCOSE 104*  --  90  BUN <5*  --  8  CREATININE 0.61  --  0.84  CALCIUM 8.5*  --  9.4  MG  --   < > 1.9  < > = values in this interval not displayed. Liver Function Tests:  Recent Labs Lab 12/29/16 0418 12/30/16 0557  AST 123* 80*  ALT 38 35  ALKPHOS 112 113  BILITOT 1.1 1.2  PROT 6.6 6.7  ALBUMIN 2.8* 2.9*   No results for input(s): LIPASE, AMYLASE in the last 168 hours. No results for input(s): AMMONIA in the last 168 hours. CBC:  Recent Labs Lab 12/27/16 1900  12/30/16 0557 01/01/17 0430  WBC 8.8  < > 7.7 9.0  NEUTROABS 6.7  --   --   --   HGB 13.5  < > 11.2* 12.1*  HCT 40.5  < > 34.2* 36.9*  MCV 101.5*  < > 102.4* 101.9*  PLT 84*  < > 99* 174  < > = values in this interval not displayed. Cardiac Enzymes: No results for input(s): CKTOTAL, CKMB, CKMBINDEX, TROPONINI in  the last 168 hours. BNP: Invalid input(s): POCBNP CBG: No results for input(s): GLUCAP in the last 168 hours.  Significant Diagnostic Studies:  Ct Angio Head W Or Wo Contrast  Result Date: 12/28/2016 CLINICAL DATA:  61 y/o M; weakness history of ETOH abuse currently in delirium tremens. EXAM: CT ANGIOGRAPHY HEAD AND NECK TECHNIQUE: Multidetector CT imaging of the head and neck was performed using the standard protocol during bolus administration of intravenous contrast. Multiplanar CT image reconstructions and MIPs were obtained to evaluate the vascular anatomy. Carotid stenosis measurements (when applicable) are obtained utilizing NASCET criteria, using the distal internal carotid diameter as the denominator. CONTRAST:  50 cc Isovue 370 COMPARISON:  12/27/2016 MRI and MRA of the head. FINDINGS: CT HEAD FINDINGS Brain: Area of hypoattenuation within the right anterior basal ganglia involving caudate head and lentiform nucleus corresponding to diffusion signal abnormality on MRI consistent with evolving acute infarction. No acute hemorrhage or new area of infarction. No significant  mass effect. No extra-axial collection or hydrocephalus. Stable chronic lacunar infarct within left caudate head, mild volume loss of the brain, and mild chronic microvascular ischemic changes. Vascular: As below. Skull: Normal. Negative for fracture or focal lesion. Sinuses: Imaged portions are clear. Orbits: No acute finding. Review of the MIP images confirms the above findings CTA NECK FINDINGS Aortic arch: Mild aortic atherosclerosis with arch calcification. Four vessel arch. Right carotid system: Dense calcified plaque of right carotid bifurcation with severe 90% stenosis of proximal right ICA. Areas of fibrofatty plaque of right common carotid artery without significant stenosis. Left carotid system: Patent left common carotid artery. Occlusion of left internal carotid artery from the bifurcation to the carotid terminus  which is patent. Vertebral arteries: Codominant. No evidence of dissection, stenosis (50% or greater) or occlusion. Skeleton: Mild cervical spondylosis greatest at the C6-7 level. No high-grade bony canal stenosis or foraminal narrowing. Other neck: Negative appear Upper chest: 4 mm right upper lobe and right lower lobe pulmonary nodules (series 601, image 38 and 5). Mild centrilobular emphysematous changes with a large right upper lobe bulla anterior to mediastinum. Review of the MIP images confirms the above findings CTA HEAD FINDINGS Moderate motion artifact. Suboptimal evaluation for subtle stenosis or small aneurysm. Anterior circulation: Calcific atherosclerosis of right cavernous and supraclinoid segments without high-grade stenosis. Occluded left internal carotid artery to the level of the terminus which is patent. Bilateral middle cerebral arteries, anterior cerebral arteries, and branches are patent. Otherwise no high-grade stenosis, proximal occlusion, aneurysm, or vascular malformation identified. Posterior circulation: No high-grade stenosis, proximal occlusion, aneurysm, or vascular malformation. Venous sinuses: As permitted by contrast timing, patent. Anatomic variants: Patent anterior communicating and bilateral posterior communicating arteries. Delayed phase: No abnormal intracranial enhancement. Review of the MIP images confirms the above findings IMPRESSION: 1. Right anterior basal ganglia acute infarction stable in size in comparison with prior MRI. No hemorrhage identified. 2. 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 This may represent sequelae of age indeterminate dissection or atherosclerotic disease. 3. Proximal right ICA severe 90% stenosis secondary to dense calcified plaque. 4. Patent vertebral arteries in the neck without high-grade stenosis. 5. Motion degraded CTA of the head. No high-grade stenosis, proximal occlusion,  aneurysm, or vascular malformation of the anterior cerebral arteries, middle cerebral arteries, or posterior circulation identified. 6. 4 mm pulmonary nodules in the right upper and right lower lobe. No follow-up needed if patient is low-risk (and has no known or suspected primary neoplasm). Non-contrast chest CT can be considered in 12 months if patient is high-risk. This recommendation follows the consensus statement: Guidelines for Management of Incidental Pulmonary Nodules Detected on CT Images: From the Fleischner Society 2017; Radiology 2017; 284:228-243. 7. Mild centrilobular emphysema. Electronically Signed   By: Mitzi Hansen M.D.   On: 12/28/2016 17:06   Dg Chest 2 View  Result Date: 12/27/2016 CLINICAL DATA:  Weakness and weight loss. EXAM: CHEST  2 VIEW COMPARISON:  Report from 08/09/2000 FINDINGS: The heart size and mediastinal contours are within normal limits. The lungs are mildly hyperinflated without pneumonic consolidation, CHF nor effusion. No pneumothorax. Bilateral nipple shadows are seen over the lower lobes. The visualized skeletal structures are unremarkable. IMPRESSION: No active cardiopulmonary disease. Mild hyperinflation of the lungs. Electronically Signed   By: Tollie Eth M.D.   On: 12/27/2016 19:27   Dg Abd 1 View  Result Date: 12/27/2016 CLINICAL DATA:  Pre MRI for foreign body in the intestine.  EXAM: ABDOMEN - 1 VIEW COMPARISON:  None. FINDINGS: The bowel gas pattern is normal. No radio-opaque calculi nor foreign body identified. Osteophytes are seen along the dorsal spine. No acute osseous abnormality. No free air. IMPRESSION: No metallic foreign body in the abdomen or pelvis. Unremarkable bowel gas pattern. Electronically Signed   By: Tollie Eth M.D.   On: 12/27/2016 23:43   Ct Head Wo Contrast  Result Date: 12/27/2016 CLINICAL DATA:  Initial evaluation for acute weakness. EXAM: CT HEAD WITHOUT CONTRAST TECHNIQUE: Contiguous axial images were obtained from  the base of the skull through the vertex without intravenous contrast. COMPARISON:  Prior CT from 05/25/2005. FINDINGS: Brain: Diffuse prominence of the CSF containing spaces is compatible with generalized cerebral atrophy. Patchy hypodensity within the periventricular and deep white matter of both cerebral hemispheres is most consistent with chronic microvascular ischemic disease. Small remote lacunar infarct within the left basal ganglia. There is evolving hypodensity involving the right basal ganglia, with involvement of the right caudate and anterior lenticular nucleus, compatible with acute ischemic infarct. No associated mass effect or hemorrhage. No other acute intracranial hemorrhage or infarct identified. No mass lesion, midline shift, or mass effect. No hydrocephalus. No extra-axial fluid collection. Vascular: No hyperdense vessel. Scattered vascular calcifications noted within the carotid siphons. Skull: Focal hyperdensity noted at the right frontal scalp, of doubtful clinical significance. Scalp soft tissues demonstrate no acute abnormality. Calvarium intact. Sinuses/Orbits: Globes and orbital soft tissues within normal limits. Paranasal sinuses and mastoid air cells are clear. IMPRESSION: 1. Evolving acute/early subacute ischemic right basal ganglia infarct. 2. Small remote lacunar infarct within the left basal ganglia. 3. Age-related cerebral atrophy with chronic microvascular ischemic disease. Electronically Signed   By: Rise Mu M.D.   On: 12/27/2016 21:51   Ct Angio Neck W Or Wo Contrast  Result Date: 12/28/2016 CLINICAL DATA:  61 y/o M; weakness history of ETOH abuse currently in delirium tremens. EXAM: CT ANGIOGRAPHY HEAD AND NECK TECHNIQUE: Multidetector CT imaging of the head and neck was performed using the standard protocol during bolus administration of intravenous contrast. Multiplanar CT image reconstructions and MIPs were obtained to evaluate the vascular anatomy. Carotid  stenosis measurements (when applicable) are obtained utilizing NASCET criteria, using the distal internal carotid diameter as the denominator. CONTRAST:  50 cc Isovue 370 COMPARISON:  12/27/2016 MRI and MRA of the head. FINDINGS: CT HEAD FINDINGS Brain: Area of hypoattenuation within the right anterior basal ganglia involving caudate head and lentiform nucleus corresponding to diffusion signal abnormality on MRI consistent with evolving acute infarction. No acute hemorrhage or new area of infarction. No significant mass effect. No extra-axial collection or hydrocephalus. Stable chronic lacunar infarct within left caudate head, mild volume loss of the brain, and mild chronic microvascular ischemic changes. Vascular: As below. Skull: Normal. Negative for fracture or focal lesion. Sinuses: Imaged portions are clear. Orbits: No acute finding. Review of the MIP images confirms the above findings CTA NECK FINDINGS Aortic arch: Mild aortic atherosclerosis with arch calcification. Four vessel arch. Right carotid system: Dense calcified plaque of right carotid bifurcation with severe 90% stenosis of proximal right ICA. Areas of fibrofatty plaque of right common carotid artery without significant stenosis. Left carotid system: Patent left common carotid artery. Occlusion of left internal carotid artery from the bifurcation to the carotid terminus which is patent. Vertebral arteries: Codominant. No evidence of dissection, stenosis (50% or greater) or occlusion. Skeleton: Mild cervical spondylosis greatest at the C6-7 level. No high-grade bony canal stenosis or  foraminal narrowing. Other neck: Negative appear Upper chest: 4 mm right upper lobe and right lower lobe pulmonary nodules (series 601, image 38 and 5). Mild centrilobular emphysematous changes with a large right upper lobe bulla anterior to mediastinum. Review of the MIP images confirms the above findings CTA HEAD FINDINGS Moderate motion artifact. Suboptimal evaluation  for subtle stenosis or small aneurysm. Anterior circulation: Calcific atherosclerosis of right cavernous and supraclinoid segments without high-grade stenosis. Occluded left internal carotid artery to the level of the terminus which is patent. Bilateral middle cerebral arteries, anterior cerebral arteries, and branches are patent. Otherwise no high-grade stenosis, proximal occlusion, aneurysm, or vascular malformation identified. Posterior circulation: No high-grade stenosis, proximal occlusion, aneurysm, or vascular malformation. Venous sinuses: As permitted by contrast timing, patent. Anatomic variants: Patent anterior communicating and bilateral posterior communicating arteries. Delayed phase: No abnormal intracranial enhancement. Review of the MIP images confirms the above findings IMPRESSION: 1. Right anterior basal ganglia acute infarction stable in size in comparison with prior MRI. No hemorrhage identified. 2. 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 This may represent sequelae of age indeterminate dissection or atherosclerotic disease. 3. Proximal right ICA severe 90% stenosis secondary to dense calcified plaque. 4. Patent vertebral arteries in the neck without high-grade stenosis. 5. Motion degraded CTA of the head. No high-grade stenosis, proximal occlusion, aneurysm, or vascular malformation of the anterior cerebral arteries, middle cerebral arteries, or posterior circulation identified. 6. 4 mm pulmonary nodules in the right upper and right lower lobe. No follow-up needed if patient is low-risk (and has no known or suspected primary neoplasm). Non-contrast chest CT can be considered in 12 months if patient is high-risk. This recommendation follows the consensus statement: Guidelines for Management of Incidental Pulmonary Nodules Detected on CT Images: From the Fleischner Society 2017; Radiology 2017; [id number]. 7. Mild centrilobular  emphysema. Electronically Signed   By: Mitzi Hansen M.D.   On: 12/28/2016 17:06   Mr Maxine Glenn Head Wo Contrast  Result Date: 12/28/2016 CLINICAL DATA:  Initial evaluation for acute stroke. EXAM: MRI HEAD WITHOUT CONTRAST MRA HEAD WITHOUT CONTRAST TECHNIQUE: Multiplanar, multiecho pulse sequences of the brain and surrounding structures were obtained without intravenous contrast. Angiographic images of the head were obtained using MRA technique without contrast. COMPARISON:  Prior CT from earlier the same day. FINDINGS: MRI HEAD FINDINGS Brain: Study degraded by motion artifact. Mild diffuse prominence of the CSF containing spaces is compatible with generalized age-related cerebral atrophy. Patchy T2/FLAIR hyperintensity within the periventricular and deep white matter both cerebral hemispheres most compatible chronic microvascular ischemic disease, mild to moderate in nature. Abnormal restricted diffusion involves the right basal ganglia, specifically involving the anterior right lentiform nucleus and right caudate head. Involvement of the anterior limb of the right internal capsule. Associated T2/FLAIR signal abnormality without significant mass effect. This is largely acute in appearance on this exam. No associated hemorrhage. No other evidence for acute ischemia. No mass lesion, midline shift or mass effect. No hydrocephalus. No extra-axial fluid collection. Major dural sinuses are grossly patent. No intrinsic temporal lobe abnormality. Pituitary gland and suprasellar region within normal limits. Vascular: Abnormal flow void within the left ICA to the terminus. Major intracranial vascular flow voids otherwise maintained. Skull and upper cervical spine: Craniocervical junction normal. Visualized upper cervical spine unremarkable. Bone marrow signal intensity within normal limits. No scalp soft tissue abnormality. Sinuses/Orbits: Globes and orbital soft tissues within normal limits. Scattered mucosal  thickening within the ethmoidal air cells  and maxillary sinuses. No air-fluid level to suggest active sinus infection. No mastoid effusion. Inner ear structures grossly normal. MRA HEAD FINDINGS ANTERIOR CIRCULATION: Study degraded by motion artifact. Left ICA is occluded to the level of the terminus. Distal cervical right ICA widely patent with antegrade flow. Petrous, cavernous, and supraclinoid right ICA patent without high-grade stenosis. Right A1 segment dominant and widely patent. There is a patent hypoplastic left A1 segment. Anterior communicating artery normal. Anterior cerebral arteries patent to their distal aspects with suspected multifocal atheromatous irregularity. Left A1 segment patent. There is an apparent short-segment severe proximal left M2 stenosis (series 13, image 37). Small vessel atheromatous irregularity distally within the left MCA branches. Short-segment moderate stenosis within the distal right M1 segment (series 13, image 71). Distal small vessel atheromatous irregularity within the right MCA branches. POSTERIOR CIRCULATION: Vertebral arteries code dominant and patent to the vertebrobasilar junction. Posterior inferior cerebral arteries not well evaluated on this motion degraded study. Basilar artery mildly tortuous but widely patent to its distal aspect. Superior cerebral arteries patent bilaterally. Posterior cerebral arteries largely supplied via the basilar artery and are widely patent to their distal aspects. Small bilateral posterior communicating arteries noted. No aneurysm or vascular malformation. IMPRESSION: MRI HEAD IMPRESSION: 1. 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 No associated hemorrhage or significant mass effect. 2. 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 MRA HEAD IMPRESSION: 1. 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 2. Widely patent right carotid artery system. 3. Moderate multifocal atheromatous disease involving  the anterior circulation as above. 4. Widely patent vertebrobasilar system. Electronically Signed   By: Rise Mu M.D.   On: 12/28/2016 01:30   Mr Brain Wo Contrast (neuro Protocol)  Result Date: 12/28/2016 CLINICAL DATA:  Initial evaluation for acute stroke. EXAM: MRI HEAD WITHOUT CONTRAST MRA HEAD WITHOUT CONTRAST TECHNIQUE: Multiplanar, multiecho pulse sequences of the brain and surrounding structures were obtained without intravenous contrast. Angiographic images of the head were obtained using MRA technique without contrast. COMPARISON:  Prior CT from earlier the same day. FINDINGS: MRI HEAD FINDINGS Brain: Study degraded by motion artifact. Mild diffuse prominence of the CSF containing spaces is compatible with generalized age-related cerebral atrophy. Patchy T2/FLAIR hyperintensity within the periventricular and deep white matter both cerebral hemispheres most compatible chronic microvascular ischemic disease, mild to moderate in nature. Abnormal restricted diffusion involves the right basal ganglia, specifically involving the anterior right lentiform nucleus and right caudate head. Involvement of the anterior limb of the right internal capsule. Associated T2/FLAIR signal abnormality without significant mass effect. This is largely acute in appearance on this exam. No associated hemorrhage. No other evidence for acute ischemia. No mass lesion, midline shift or mass effect. No hydrocephalus. No extra-axial fluid collection. Major dural sinuses are grossly patent. No intrinsic temporal lobe abnormality. Pituitary gland and suprasellar region within normal limits. Vascular: Abnormal flow void within the left ICA to the terminus. Major intracranial vascular flow voids otherwise maintained. Skull and upper cervical spine: Craniocervical junction normal. Visualized upper cervical spine unremarkable. Bone marrow signal intensity within normal limits. No scalp soft tissue abnormality. Sinuses/Orbits:  Globes and orbital soft tissues within normal limits. Scattered mucosal thickening within the ethmoidal air cells and maxillary sinuses. No air-fluid level to suggest active sinus infection. No mastoid effusion. Inner ear structures grossly normal. MRA HEAD FINDINGS ANTERIOR CIRCULATION: Study degraded by motion artifact. Left ICA is occluded to the level of the terminus. Distal cervical right ICA widely patent with  antegrade flow. Petrous, cavernous, and supraclinoid right ICA patent without high-grade stenosis. Right A1 segment dominant and widely patent. There is a patent hypoplastic left A1 segment. Anterior communicating artery normal. Anterior cerebral arteries patent to their distal aspects with suspected multifocal atheromatous irregularity. Left A1 segment patent. There is an apparent short-segment severe proximal left M2 stenosis (series 13, image 37). Small vessel atheromatous irregularity distally within the left MCA branches. Short-segment moderate stenosis within the distal right M1 segment (series 13, image 71). Distal small vessel atheromatous irregularity within the right MCA branches. POSTERIOR CIRCULATION: Vertebral arteries code dominant and patent to the vertebrobasilar junction. Posterior inferior cerebral arteries not well evaluated on this motion degraded study. Basilar artery mildly tortuous but widely patent to its distal aspect. Superior cerebral arteries patent bilaterally. Posterior cerebral arteries largely supplied via the basilar artery and are widely patent to their distal aspects. Small bilateral posterior communicating arteries noted. No aneurysm or vascular malformation. IMPRESSION: MRI HEAD IMPRESSION: 1. 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 No associated hemorrhage or significant mass effect. 2. 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 MRA HEAD IMPRESSION: 1. 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 2. Widely patent right  carotid artery system. 3. 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 4. Widely patent vertebrobasilar system. Electronically Signed   By: Rise Mu M.D.   On: 12/28/2016 01:30    2D ECHO: Study Conclusions  - Left ventricle: The cavity size was normal. Wall thickness was   increased in a pattern of mild LVH. There was mild focal basal   hypertrophy of the septum. Systolic function was normal. The   estimated ejection fraction was in the range of 50% to 55%. Wall   motion was normal; there were no regional wall motion   abnormalities. Doppler parameters are consistent with abnormal   left ventricular relaxation (grade 1 diastolic dysfunction). - Mitral valve: Calcified annulus.  Impressions:  - Normal LV systolic function; grade 1 diastolic dysfunction.  Disposition and Follow-up: Discharge Instructions    Ambulatory referral to Neurology    Complete by:  As directed    Follow up with NP Darrol Angel at [facility] in about 2 months. Thanks.   Ambulatory referral to Neurology    Complete by:  As directed    An appointment is requested in approximately: Stroke follow-up, Dr Roda Shutters, 6 weeks   Diet - low sodium heart healthy    Complete by:  As directed    Increase activity slowly    Complete by:  As directed        DISPOSITION:  Home    DISCHARGE FOLLOW-UP Follow-up Information    Nilda Riggs, NP. Schedule an appointment as soon as possible for a visit in 6 week(s).   Specialty:  Family Medicine Contact information: [address] [place] [address] [phone]        Xu,Jindong, MD. Schedule an appointment as soon as possible for a visit in 6 week(s).   Specialty:  Neurology Contact information: [address] [place] [address] [phone]        Fabienne Bruns, MD. Schedule an appointment as soon as possible for a visit in 1 week(s).   Specialties:  Vascular Surgery,  Cardiology Why:  your surgery is scheduled on 01/12/17. Please call the office for all the instructions  Contact information: [address] [place] [address] [phone]  Time spent on Discharge: 25 mins   Signed:   RAI,RIPUDEEP M.D. [facility] 01/02/2017, 11:10 AM Pager: [phone]

## 2017-01-02 NOTE — Progress Notes (Signed)
NURSING PROGRESS NOTE  Ian Thornton Dahmen [id number] Discharge Data: 01/02/2017 2:56 PM Attending Provider: No att. providers found PCP:No primary care provider on file.   Ian Thornton Plocher to be D/C'd Home per MD order.    All IV's will be discontinued and monitored for bleeding.  All belongings will be returned to patient for patient to take home.  Last Documented Vital Signs:  Blood pressure 133/65, pulse 79, temperature 98.5 F (36.9 C), temperature source Oral, resp. rate 16, height 5\' 10"  (1.778 m), weight 62.7 kg (138 lb 3.2 oz), SpO2 99 %.  Madelin RearLonnie Averyanna Sax, MSN, RN, [employer]RN

## 2017-01-05 LAB — CULTURE, BLOOD (ROUTINE X 2)
CULTURE: NO GROWTH
Culture: NO GROWTH

## 2017-01-08 ENCOUNTER — Other Ambulatory Visit: Payer: Self-pay

## 2017-01-09 NOTE — Progress Notes (Signed)
Several unsuccessful attempts have been made to contact pt; unable to lvm, no voice mailbox is available.

## 2017-01-12 ENCOUNTER — Encounter (HOSPITAL_COMMUNITY): Payer: Self-pay | Admitting: Critical Care Medicine

## 2017-01-12 SURGERY — ENDARTERECTOMY, CAROTID
Anesthesia: General | Laterality: Right

## 2017-01-13 ENCOUNTER — Inpatient Hospital Stay: Admit: 2017-01-13 | Payer: MEDICAID | Admitting: Vascular Surgery

## 2017-10-30 ENCOUNTER — Other Ambulatory Visit: Payer: Self-pay

## 2017-10-30 ENCOUNTER — Emergency Department (HOSPITAL_COMMUNITY)
Admission: EM | Admit: 2017-10-30 | Discharge: 2017-10-30 | Disposition: A | Discharge status: Home / Self Care | Payer: Non-veteran care | Attending: Emergency Medicine | Admitting: Emergency Medicine

## 2017-10-30 ENCOUNTER — Encounter (HOSPITAL_COMMUNITY): Payer: Self-pay

## 2017-10-30 ENCOUNTER — Emergency Department (HOSPITAL_COMMUNITY): Payer: Non-veteran care

## 2017-10-30 DIAGNOSIS — E785 Hyperlipidemia, unspecified: Secondary | ICD-10-CM | POA: Diagnosis not present

## 2017-10-30 DIAGNOSIS — F10120 Alcohol abuse with intoxication, uncomplicated: Secondary | ICD-10-CM | POA: Diagnosis not present

## 2017-10-30 DIAGNOSIS — Y998 Other external cause status: Secondary | ICD-10-CM | POA: Diagnosis not present

## 2017-10-30 DIAGNOSIS — Y9289 Other specified places as the place of occurrence of the external cause: Secondary | ICD-10-CM | POA: Diagnosis not present

## 2017-10-30 DIAGNOSIS — F17200 Nicotine dependence, unspecified, uncomplicated: Secondary | ICD-10-CM | POA: Diagnosis not present

## 2017-10-30 DIAGNOSIS — S0181XA Laceration without foreign body of other part of head, initial encounter: Secondary | ICD-10-CM | POA: Diagnosis not present

## 2017-10-30 DIAGNOSIS — S0083XA Contusion of other part of head, initial encounter: Secondary | ICD-10-CM

## 2017-10-30 DIAGNOSIS — Z8673 Personal history of transient ischemic attack (TIA), and cerebral infarction without residual deficits: Secondary | ICD-10-CM | POA: Insufficient documentation

## 2017-10-30 DIAGNOSIS — S0990XA Unspecified injury of head, initial encounter: Secondary | ICD-10-CM | POA: Diagnosis present

## 2017-10-30 DIAGNOSIS — Y939 Activity, unspecified: Secondary | ICD-10-CM | POA: Diagnosis not present

## 2017-10-30 DIAGNOSIS — Z7982 Long term (current) use of aspirin: Secondary | ICD-10-CM | POA: Insufficient documentation

## 2017-10-30 DIAGNOSIS — F10920 Alcohol use, unspecified with intoxication, uncomplicated: Secondary | ICD-10-CM

## 2017-10-30 DIAGNOSIS — Z79899 Other long term (current) drug therapy: Secondary | ICD-10-CM | POA: Insufficient documentation

## 2017-10-30 LAB — CBC WITH DIFFERENTIAL/PLATELET
Basophils Absolute: 0 10*3/uL (ref 0.0–0.1)
Basophils Relative: 0 %
EOS PCT: 2 %
Eosinophils Absolute: 0.1 10*3/uL (ref 0.0–0.7)
HCT: 37.9 % — ABNORMAL LOW (ref 39.0–52.0)
Hemoglobin: 12.6 g/dL — ABNORMAL LOW (ref 13.0–17.0)
LYMPHS ABS: 2.4 10*3/uL (ref 0.7–4.0)
Lymphocytes Relative: 38 %
MCH: 30.7 pg (ref 26.0–34.0)
MCHC: 33.2 g/dL (ref 30.0–36.0)
MCV: 92.4 fL (ref 78.0–100.0)
Monocytes Absolute: 0.3 10*3/uL (ref 0.1–1.0)
Monocytes Relative: 5 %
Neutro Abs: 3.5 10*3/uL (ref 1.7–7.7)
Neutrophils Relative %: 55 %
PLATELETS: 291 10*3/uL (ref 150–400)
RBC: 4.1 MIL/uL — AB (ref 4.22–5.81)
RDW: 14.2 % (ref 11.5–15.5)
WBC: 6.3 10*3/uL (ref 4.0–10.5)

## 2017-10-30 LAB — BASIC METABOLIC PANEL
Anion gap: 10 (ref 5–15)
BUN: 13 mg/dL (ref 6–20)
CHLORIDE: 103 mmol/L (ref 101–111)
CO2: 23 mmol/L (ref 22–32)
Calcium: 9.1 mg/dL (ref 8.9–10.3)
Creatinine, Ser: 0.79 mg/dL (ref 0.61–1.24)
GFR calc Af Amer: >60 mL/min (ref >60)
GFR calc non Af Amer: >60 mL/min (ref >60)
GLUCOSE: 97 mg/dL (ref 65–99)
POTASSIUM: 3.3 mmol/L — AB (ref 3.5–5.1)
Sodium: 136 mmol/L (ref 135–145)

## 2017-10-30 LAB — ETHANOL: Alcohol, Ethyl (B): 316 mg/dL (ref <10)

## 2017-10-30 MED ORDER — LIDOCAINE HCL (PF) 1 % IJ SOLN
5.0000 mL | Freq: Once | INTRAMUSCULAR | Status: AC
  Administered 2017-10-30: 5 mL via INTRADERMAL
  Filled 2017-10-30: qty 5

## 2017-10-30 NOTE — ED Notes (Signed)
Patient transported to CT 
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
## 2017-10-30 NOTE — Discharge Instructions (Signed)
Local wound care with bacitracin and dressing changes twice daily.  Follow-up with your primary doctor for suture removal in 5-7 days.  Sooner if you experience any new or concerning symptoms.
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
## 2018-01-05 ENCOUNTER — Encounter: Payer: Self-pay | Admitting: Neurology

## 2018-01-12 ENCOUNTER — Emergency Department (HOSPITAL_COMMUNITY)
Admission: EM | Admit: 2018-01-12 | Discharge: 2018-01-12 | Disposition: A | Discharge status: Home / Self Care | Payer: Non-veteran care | Attending: Emergency Medicine | Admitting: Emergency Medicine

## 2018-01-12 ENCOUNTER — Encounter (HOSPITAL_COMMUNITY): Payer: Self-pay | Admitting: *Deleted

## 2018-01-12 DIAGNOSIS — Z7982 Long term (current) use of aspirin: Secondary | ICD-10-CM | POA: Diagnosis not present

## 2018-01-12 DIAGNOSIS — K4090 Unilateral inguinal hernia, without obstruction or gangrene, not specified as recurrent: Secondary | ICD-10-CM | POA: Insufficient documentation

## 2018-01-12 DIAGNOSIS — K469 Unspecified abdominal hernia without obstruction or gangrene: Secondary | ICD-10-CM | POA: Diagnosis present

## 2018-01-12 DIAGNOSIS — Z79899 Other long term (current) drug therapy: Secondary | ICD-10-CM | POA: Insufficient documentation

## 2018-01-12 LAB — URINALYSIS, ROUTINE W REFLEX MICROSCOPIC
Bilirubin Urine: NEGATIVE
GLUCOSE, UA: NEGATIVE mg/dL
Hgb urine dipstick: NEGATIVE
Ketones, ur: NEGATIVE mg/dL
LEUKOCYTES UA: NEGATIVE
Nitrite: NEGATIVE
PROTEIN: NEGATIVE mg/dL
SPECIFIC GRAVITY, URINE: 1.004 — AB (ref 1.005–1.030)
pH: 6 (ref 5.0–8.0)

## 2018-01-12 LAB — LIPASE, BLOOD: LIPASE: 35 U/L (ref 11–51)

## 2018-01-12 LAB — CBC
HEMATOCRIT: 42.2 % (ref 39.0–52.0)
HEMOGLOBIN: 13.7 g/dL (ref 13.0–17.0)
MCH: 29.8 pg (ref 26.0–34.0)
MCHC: 32.5 g/dL (ref 30.0–36.0)
MCV: 91.9 fL (ref 78.0–100.0)
PLATELETS: 307 10*3/uL (ref 150–400)
RBC: 4.59 MIL/uL (ref 4.22–5.81)
RDW: 15.4 % (ref 11.5–15.5)
WBC: 5.8 10*3/uL (ref 4.0–10.5)

## 2018-01-12 LAB — COMPREHENSIVE METABOLIC PANEL
ALT: 15 U/L — ABNORMAL LOW (ref 17–63)
ANION GAP: 12 (ref 5–15)
AST: 25 U/L (ref 15–41)
Albumin: 3.6 g/dL (ref 3.5–5.0)
Alkaline Phosphatase: 66 U/L (ref 38–126)
BUN: 12 mg/dL (ref 6–20)
CHLORIDE: 107 mmol/L (ref 101–111)
CO2: 23 mmol/L (ref 22–32)
CREATININE: 0.76 mg/dL (ref 0.61–1.24)
Calcium: 9.3 mg/dL (ref 8.9–10.3)
GFR calc Af Amer: >60 mL/min (ref >60)
Glucose, Bld: 89 mg/dL (ref 65–99)
POTASSIUM: 3.6 mmol/L (ref 3.5–5.1)
SODIUM: 142 mmol/L (ref 135–145)
Total Bilirubin: 0.7 mg/dL (ref 0.3–1.2)
Total Protein: 7.2 g/dL (ref 6.5–8.1)

## 2018-01-12 MED ORDER — HYDROCODONE-ACETAMINOPHEN 5-325 MG PO TABS: 1.0000 | ORAL_TABLET | ORAL | 0 refills | Status: DC | PRN

## 2018-01-12 NOTE — ED Triage Notes (Signed)
[place]. Pt states that he has a right hernia that is extending into his scrotum.

## 2018-01-12 NOTE — ED Notes (Signed)
Paged general surgery

## 2018-01-12 NOTE — ED Provider Notes (Signed)
[facility] EMERGENCY DEPARTMENT Provider Note   CSN: [id number] Arrival date & time: 01/12/18  1031     History   Chief Complaint Chief Complaint  Patient presents with  . Hernia    HPI Ian Thornton is a 62 y.o. male.  HPI Patient presents with reported hernia.  States that he received a letter from the [place] telling to come here however states he does not have the letter.  States he has swelling in his right groin.  Also pain in his right testicle.  States he is worried because his friends told him he will lose the testicle.  No dysuria.  No trauma.  States the bump will come and go and gets worse when he lifts things.  States the pain is actually now resolved.  No fevers or chills.  Pain improved but thinks he will need something for the pain. Past Medical History:  Diagnosis Date  . ETOH abuse     Patient Active Problem List   Diagnosis Date Noted  . Cellulitis 01/01/2017  . CVA (cerebral vascular accident) (HCC) 12/28/2016  . ETOH abuse 12/28/2016  . DTs (delirium tremens) (HCC) 12/28/2016  . Thrombocytopenia (HCC) 12/28/2016  . Alcohol withdrawal seizure without complication (HCC)   . Hyperlipidemia   . Smoker   . Acute ischemic left MCA stroke (HCC)   . Hypokalemia   . Hypomagnesemia   . Dysarthria 12/27/2016    History reviewed. No pertinent surgical history.     Home Medications    Prior to Admission medications   Medication Sig Start Date End Date Taking? Authorizing Provider  aspirin EC 325 MG EC tablet Take 1 tablet (325 mg total) by mouth daily. 01/02/17   Rai, Delene Ruffini, MD  atorvastatin (LIPITOR) 40 MG tablet Take 1 tablet (40 mg total) by mouth at bedtime. 01/01/17   Rai, Delene Ruffini, MD  doxycycline (VIBRAMYCIN) 100 MG capsule Take 1 capsule (100 mg total) by mouth 2 (two) times daily. X 7 days 01/01/17   Cathren Harsh, MD  folic acid (FOLVITE) 1 MG tablet Take 1 tablet (1 mg total) by mouth daily. 01/02/17   Rai, Delene Ruffini, MD    HYDROcodone-acetaminophen (NORCO/VICODIN) 5-325 MG tablet Take 1-2 tablets by mouth every 4 (four) hours as needed. 01/12/18   Benjiman Core, MD  Multiple Vitamin (MULTIVITAMIN WITH MINERALS) TABS tablet Take 1 tablet by mouth daily. 01/02/17   Cathren Harsh, MD    Family History Family History  Family history unknown: Yes    Social History Social History   Tobacco Use  . Smoking status: Current Every Day Smoker    Packs/day: 0.50  . Smokeless tobacco: Never Used  Substance Use Topics  . Alcohol use: Yes    Alcohol/week: 9.6 oz    Types: 16 Shots of liquor per week    Comment: 24 oz beer per day  . Drug use: No    Comment: previously used cocaine     Allergies   No known allergies   Review of Systems Review of Systems  Constitutional: Negative for appetite change, chills and fever.  Respiratory: Negative for shortness of breath.   Gastrointestinal: Positive for abdominal pain. Negative for nausea and vomiting.  Genitourinary: Positive for testicular pain. Negative for penile pain and penile swelling.  Neurological: Negative for numbness.  Hematological: Negative for adenopathy.  Psychiatric/Behavioral: Negative for confusion.     Physical Exam Updated Vital Signs BP (!) 156/90 (BP Location: Right Arm)  Pulse 80   Temp 98.1 F (36.7 C) (Oral)   Resp 16   Ht 5\' 7"  (1.702 m)   Wt 72.6 kg (160 lb)   SpO2 100%   BMI 25.06 kg/m   Physical Exam  Constitutional: He appears well-developed.  HENT:  Head: Atraumatic.  Eyes: Pupils are equal, round, and reactive to light.  Cardiovascular: Normal rate.  Pulmonary/Chest: Effort normal.  Abdominal: He exhibits no distension. There is no tenderness.  Some fullness of right inguinal area.  Reduces.  Also on left side has some but left acute.  Normal testicular lie.  No testicular mass.  No testicular tenderness.  Musculoskeletal: He exhibits no tenderness.  Neurological: He is alert.  Skin: Skin is warm.      ED Treatments / Results  Labs (all labs ordered are listed, but only abnormal results are displayed) Labs Reviewed  COMPREHENSIVE METABOLIC PANEL - Abnormal; Notable for the following components:      Result Value   ALT 15 (*)    All other components within normal limits  URINALYSIS, ROUTINE W REFLEX MICROSCOPIC - Abnormal; Notable for the following components:   Color, Urine STRAW (*)    Specific Gravity, Urine 1.004 (*)    All other components within normal limits  LIPASE, BLOOD  CBC    EKG  EKG Interpretation None       Radiology No results found.  Procedures Procedures (including critical care time)  Medications Ordered in ED Medications - No data to display   Initial Impression / Assessment and Plan / ED Course  I have reviewed the triage vital signs and the nursing notes.  Pertinent labs & imaging results that were available during my care of the patient were reviewed by me and considered in my medical decision making (see chart for details).     Patient with likely inguinal hernias.  Not obstructive this time.  Pain-free now.   discussed with general surgery, who arranged follow-up in the office tomorrow.  [facility].  Will discharge home.  [employer] [place] drug database reviewed.  Final Clinical Impressions(s) / ED Diagnoses   Final diagnoses:  Unilateral inguinal hernia without obstruction or gangrene, recurrence not specified    ED Discharge Orders        Ordered    HYDROcodone-acetaminophen (NORCO/VICODIN) 5-325 MG tablet  Every 4 hours PRN     01/12/18 1446       Benjiman CorePickering, Orly Quimby, MD 01/12/18 1548

## 2018-03-18 ENCOUNTER — Ambulatory Visit: Payer: Non-veteran care | Admitting: Neurology

## 2019-07-19 ENCOUNTER — Emergency Department (HOSPITAL_COMMUNITY): Payer: No Typology Code available for payment source

## 2019-07-19 ENCOUNTER — Encounter (HOSPITAL_COMMUNITY): Payer: Self-pay | Admitting: Emergency Medicine

## 2019-07-19 ENCOUNTER — Other Ambulatory Visit: Payer: Self-pay

## 2019-07-19 ENCOUNTER — Emergency Department (HOSPITAL_COMMUNITY)
Admission: EM | Admit: 2019-07-19 | Discharge: 2019-07-19 | Disposition: A | Discharge status: Home / Self Care | Payer: No Typology Code available for payment source | Attending: Emergency Medicine | Admitting: Emergency Medicine

## 2019-07-19 DIAGNOSIS — S0990XA Unspecified injury of head, initial encounter: Secondary | ICD-10-CM | POA: Diagnosis present

## 2019-07-19 DIAGNOSIS — Z23 Encounter for immunization: Secondary | ICD-10-CM | POA: Diagnosis not present

## 2019-07-19 DIAGNOSIS — Z7982 Long term (current) use of aspirin: Secondary | ICD-10-CM | POA: Insufficient documentation

## 2019-07-19 DIAGNOSIS — T07XXXA Unspecified multiple injuries, initial encounter: Secondary | ICD-10-CM

## 2019-07-19 DIAGNOSIS — H9202 Otalgia, left ear: Secondary | ICD-10-CM | POA: Diagnosis not present

## 2019-07-19 DIAGNOSIS — M542 Cervicalgia: Secondary | ICD-10-CM | POA: Diagnosis not present

## 2019-07-19 DIAGNOSIS — Z79899 Other long term (current) drug therapy: Secondary | ICD-10-CM | POA: Diagnosis not present

## 2019-07-19 DIAGNOSIS — F17210 Nicotine dependence, cigarettes, uncomplicated: Secondary | ICD-10-CM | POA: Diagnosis not present

## 2019-07-19 DIAGNOSIS — M546 Pain in thoracic spine: Secondary | ICD-10-CM | POA: Diagnosis not present

## 2019-07-19 DIAGNOSIS — Y929 Unspecified place or not applicable: Secondary | ICD-10-CM | POA: Diagnosis not present

## 2019-07-19 DIAGNOSIS — Y999 Unspecified external cause status: Secondary | ICD-10-CM | POA: Diagnosis not present

## 2019-07-19 DIAGNOSIS — S01111A Laceration without foreign body of right eyelid and periocular area, initial encounter: Secondary | ICD-10-CM | POA: Insufficient documentation

## 2019-07-19 DIAGNOSIS — Y939 Activity, unspecified: Secondary | ICD-10-CM | POA: Diagnosis not present

## 2019-07-19 LAB — BASIC METABOLIC PANEL
Anion gap: 12 (ref 5–15)
BUN: 7 mg/dL — ABNORMAL LOW (ref 8–23)
CO2: 24 mmol/L (ref 22–32)
Calcium: 8.7 mg/dL — ABNORMAL LOW (ref 8.9–10.3)
Chloride: 103 mmol/L (ref 98–111)
Creatinine, Ser: 0.71 mg/dL (ref 0.61–1.24)
GFR calc Af Amer: >60 mL/min (ref >60)
GFR calc non Af Amer: >60 mL/min (ref >60)
Glucose, Bld: 81 mg/dL (ref 70–99)
Potassium: 4.1 mmol/L (ref 3.5–5.1)
Sodium: 139 mmol/L (ref 135–145)

## 2019-07-19 LAB — CBC WITH DIFFERENTIAL/PLATELET
Abs Immature Granulocytes: 0.02 10*3/uL (ref 0.00–0.07)
Basophils Absolute: 0 10*3/uL (ref 0.0–0.1)
Basophils Relative: 0 %
Eosinophils Absolute: 0 10*3/uL (ref 0.0–0.5)
Eosinophils Relative: 0 %
HCT: 37.7 % — ABNORMAL LOW (ref 39.0–52.0)
Hemoglobin: 12.5 g/dL — ABNORMAL LOW (ref 13.0–17.0)
Immature Granulocytes: 0 %
Lymphocytes Relative: 13 %
Lymphs Abs: 1 10*3/uL (ref 0.7–4.0)
MCH: 32.7 pg (ref 26.0–34.0)
MCHC: 33.2 g/dL (ref 30.0–36.0)
MCV: 98.7 fL (ref 80.0–100.0)
Monocytes Absolute: 0.8 10*3/uL (ref 0.1–1.0)
Monocytes Relative: 10 %
Neutro Abs: 6 10*3/uL (ref 1.7–7.7)
Neutrophils Relative %: 77 %
Platelets: 153 10*3/uL (ref 150–400)
RBC: 3.82 MIL/uL — ABNORMAL LOW (ref 4.22–5.81)
RDW: 13.4 % (ref 11.5–15.5)
WBC: 7.8 10*3/uL (ref 4.0–10.5)
nRBC: 0 % (ref 0.0–0.2)

## 2019-07-19 LAB — CK: Total CK: 831 U/L — ABNORMAL HIGH (ref 49–397)

## 2019-07-19 MED ORDER — TETANUS-DIPHTH-ACELL PERTUSSIS 5-2.5-18.5 LF-MCG/0.5 IM SUSP
0.5000 mL | Freq: Once | INTRAMUSCULAR | Status: AC
  Administered 2019-07-19: 0.5 mL via INTRAMUSCULAR
  Filled 2019-07-19: qty 0.5

## 2019-07-19 MED ORDER — ACETAMINOPHEN 500 MG PO TABS: 500.0000 mg | ORAL_TABLET | Freq: Four times a day (QID) | ORAL | 0 refills | Status: DC | PRN

## 2019-07-19 MED ORDER — MORPHINE SULFATE (PF) 4 MG/ML IV SOLN
4.0000 mg | Freq: Once | INTRAVENOUS | Status: AC
  Administered 2019-07-19: 4 mg via INTRAVENOUS
  Filled 2019-07-19: qty 1

## 2019-07-19 MED ORDER — CEPHALEXIN 500 MG PO CAPS: 500.0000 mg | ORAL_CAPSULE | Freq: Four times a day (QID) | ORAL | 0 refills | Status: DC

## 2019-07-19 MED ORDER — IBUPROFEN 600 MG PO TABS: 600.0000 mg | ORAL_TABLET | Freq: Four times a day (QID) | ORAL | 0 refills | Status: DC | PRN

## 2019-07-19 MED ORDER — ERYTHROMYCIN 5 MG/GM OP OINT
1.0000 "application " | TOPICAL_OINTMENT | Freq: Once | OPHTHALMIC | Status: AC
  Administered 2019-07-19: 1 via OPHTHALMIC
  Filled 2019-07-19: qty 3.5

## 2019-07-19 MED ORDER — SODIUM CHLORIDE 0.9 % IV BOLUS
500.0000 mL | Freq: Once | INTRAVENOUS | Status: AC
  Administered 2019-07-19: 500 mL via INTRAVENOUS

## 2019-07-19 MED ORDER — LIDOCAINE HCL (PF) 1 % IJ SOLN
5.0000 mL | Freq: Once | INTRAMUSCULAR | Status: AC
  Administered 2019-07-19: 5 mL
  Filled 2019-07-19: qty 5

## 2019-07-19 MED FILL — CEPHALEXIN 500 MG CAPSULE: 500 | 5 days supply | Qty: 20 | Fill #0

## 2019-07-19 MED FILL — ACETAMINOPHEN EXTRA STRENGT: 500 | 7 days supply | Qty: 30 | Fill #0

## 2019-07-19 MED FILL — IBUPROFEN 600 MG TABLET: 600 | 7 days supply | Qty: 30 | Fill #0

## 2019-07-19 NOTE — ED Notes (Signed)
Patient transported to X-ray 

## 2019-07-19 NOTE — TOC Transition Note (Signed)
Transition of Care [facility]) - CM/SW Discharge Note   Patient Details  Name: Ian Thornton MRN: [id number] Date of Birth: [birth year]  [facility] CM/SW Contact:  Fuller Mandril, RN Phone Number: 07/19/2019, 12:28 PM   Clinical Narrative:    [facility] consulted regarding medication needs.   Final next level of care: Home/Self Care Barriers to Discharge: Barriers Resolved   Patient Goals and CMS Choice Patient states their goals for this hospitalization and ongoing recovery are:: stop my tooth from hurting      Discharge Placement                       Discharge Plan and Services   Discharge Planning Services: CM Consult, Medication Assistance                   [facility] secured funds from petty cash to purchase Rx for pt.  EDCM utilized [facility] pharmacy.              Social Determinants of Health (SDOH) Interventions     Readmission Risk Interventions No flowsheet data found.
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
## 2019-07-19 NOTE — Discharge Instructions (Addendum)
Use ice to your face 3-4 times daily alternating 20 minutes on, 20 minutes off.  Keep your wound dry for 24 hours.  After that, you can wash your face as normal.  Keep the wound dry otherwise and do not apply antibiotic ointment as this will make the sutures dissolve faster than we want.  Your sutures should dissolve in 5 to 7 days.  If they are not pulling out with a gentle tug in 7 days, please return the emergency department for suture removal.  Please return sooner if you develop increasing pain, redness, swelling, drainage, red streaking from the wound.  Make sure to get plenty of rest and drink plenty of fluids.  Try to rest your brain and avoid looking at phones, computers, reading, or any activity that requires focus.

## 2019-07-19 NOTE — ED Notes (Signed)
Social work brought meds to bedside. Pt does not have to fill meds

## 2019-07-19 NOTE — ED Notes (Signed)
Patient transported to CT 

## 2019-07-29 NOTE — Progress Notes (Signed)
COVID-19 Screening performed. Temperature, PHQ-9, and need for medical care and medications assessed. No additional needs assessed at this time.  Mirjana Tarleton MSN, RN 
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
## 2020-05-25 ENCOUNTER — Ambulatory Visit: Payer: No Typology Code available for payment source | Attending: Internal Medicine

## 2020-05-25 DIAGNOSIS — Z23 Encounter for immunization: Secondary | ICD-10-CM

## 2020-05-25 NOTE — Progress Notes (Signed)
   Covid-19 Vaccination Clinic  Name:  Ian Thornton    MRN: [id number] DOB: [birth year]  05/25/2020  [name] was observed post Covid-19 immunization for 15 minutes without incident. He was provided with Vaccine Information Sheet and instruction to access the V-Safe system.   [name] was instructed to call 911 with any severe reactions post vaccine: Marland Kitchen Difficulty breathing  . Swelling of face and throat  . A fast heartbeat  . A bad rash all over body  . Dizziness and weakness   Immunizations Administered    Name Date Dose VIS Date Route   Moderna COVID-19 Vaccine 05/25/2020 11:14 AM 0.5 mL 11/2019 Intramuscular   Manufacturer: Gala Murdoch   Lot: [id number]   NDC: 76546-503-54

## 2020-06-22 ENCOUNTER — Ambulatory Visit: Payer: No Typology Code available for payment source | Attending: Internal Medicine

## 2020-06-22 DIAGNOSIS — Z23 Encounter for immunization: Secondary | ICD-10-CM

## 2020-06-22 NOTE — Progress Notes (Signed)
   Covid-19 Vaccination Clinic  Name:  Ian Thornton    MRN: [id number] DOB: [birth year]  06/22/2020  Mr. Ian Thornton was observed post Covid-19 immunization for 15 minutes without incident. He was provided with Vaccine Information Sheet and instruction to access the V-Safe system.   Mr. Ian Thornton was instructed to call 911 with any severe reactions post vaccine: Marland Kitchen Difficulty breathing  . Swelling of face and throat  . A fast heartbeat  . A bad rash all over body  . Dizziness and weakness   Immunizations Administered    Name Date Dose VIS Date Route   Moderna COVID-19 Vaccine 06/22/2020 11:32 AM 0.5 mL 11/2019 Intramuscular   Manufacturer: Moderna   Lot: [id number]   NDC: 02637-858-85

## 2020-06-29 ENCOUNTER — Ambulatory Visit: Payer: No Typology Code available for payment source

## 2020-08-25 ENCOUNTER — Emergency Department (HOSPITAL_COMMUNITY)
Admission: EM | Admit: 2020-08-25 | Discharge: 2020-08-26 | Disposition: A | Discharge status: Home / Self Care | Payer: No Typology Code available for payment source | Attending: Emergency Medicine | Admitting: Emergency Medicine

## 2020-08-25 DIAGNOSIS — K4090 Unilateral inguinal hernia, without obstruction or gangrene, not specified as recurrent: Secondary | ICD-10-CM | POA: Diagnosis present

## 2020-08-25 DIAGNOSIS — F17200 Nicotine dependence, unspecified, uncomplicated: Secondary | ICD-10-CM | POA: Diagnosis not present

## 2020-08-25 DIAGNOSIS — Z7982 Long term (current) use of aspirin: Secondary | ICD-10-CM | POA: Insufficient documentation

## 2020-08-25 LAB — CBC
HCT: 35.7 % — ABNORMAL LOW (ref 39.0–52.0)
Hemoglobin: 11.4 g/dL — ABNORMAL LOW (ref 13.0–17.0)
MCH: 31.8 pg (ref 26.0–34.0)
MCHC: 31.9 g/dL (ref 30.0–36.0)
MCV: 99.7 fL (ref 80.0–100.0)
Platelets: 199 10*3/uL (ref 150–400)
RBC: 3.58 MIL/uL — ABNORMAL LOW (ref 4.22–5.81)
RDW: 14.8 % (ref 11.5–15.5)
WBC: 3.8 10*3/uL — ABNORMAL LOW (ref 4.0–10.5)
nRBC: 0 % (ref 0.0–0.2)

## 2020-08-25 LAB — BASIC METABOLIC PANEL
Anion gap: 9 (ref 5–15)
BUN: 11 mg/dL (ref 8–23)
CO2: 24 mmol/L (ref 22–32)
Calcium: 8.4 mg/dL — ABNORMAL LOW (ref 8.9–10.3)
Chloride: 108 mmol/L (ref 98–111)
Creatinine, Ser: 0.79 mg/dL (ref 0.61–1.24)
GFR calc Af Amer: >60 mL/min (ref >60)
GFR calc non Af Amer: >60 mL/min (ref >60)
Glucose, Bld: 98 mg/dL (ref 70–99)
Potassium: 3.4 mmol/L — ABNORMAL LOW (ref 3.5–5.1)
Sodium: 141 mmol/L (ref 135–145)
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
## 2020-08-26 NOTE — ED Provider Notes (Signed)
[facility] EMERGENCY DEPARTMENT Provider Note   CSN: [id number] Arrival date & time: 08/25/20  1825     History Chief Complaint  Patient presents with  . Hypotension    Susan Arana is a 64 y.o. male.  64 year old male with history of CVA, dyslipidemia, alcohol abuse presents to the emergency department via EMS.  He was found sleeping at a bus stop.  Reporting drinking a beer today and having 1 small meal in the past several days.  Upon speaking with the patient, he is unsure of who called EMS and why he was transported to the ED.  His primary complaint is of a right inguinal hernia which has been chronic for at least 2 years.  Patient unable to reduce hernia at baseline; notes constant pain as a result of his hernia with right inguinal and scrotal swelling.  No medications taken for pain PTA.  States that he was referred to general surgery by the [facility] hospital, but was told he could no longer get this surgery done by the group he was referred to. Denies fever, difficulty voiding or stooling, melena, hematochezia, skin color changes to his scrotum.   The history is provided by the patient. No language interpreter was used.       Past Medical History:  Diagnosis Date  . ETOH abuse     Patient Active Problem List   Diagnosis Date Noted  . Cellulitis 01/01/2017  . CVA (cerebral vascular accident) (HCC) 12/28/2016  . ETOH abuse 12/28/2016  . DTs (delirium tremens) (HCC) 12/28/2016  . Thrombocytopenia (HCC) 12/28/2016  . Alcohol withdrawal seizure without complication (HCC)   . Hyperlipidemia   . Smoker   . Acute ischemic left MCA stroke (HCC)   . Hypokalemia   . Hypomagnesemia   . Dysarthria 12/27/2016    No past surgical history on file.     Family History  Family history unknown: Yes    Social History   Tobacco Use  . Smoking status: Current Every Day Smoker    Packs/day: 0.50  . Smokeless tobacco: Never Used  Substance Use Topics  . Alcohol  use: Yes    Alcohol/week: 16.0 standard drinks    Types: 16 Shots of liquor per week    Comment: 24 oz beer per day  . Drug use: No    Comment: previously used cocaine    Home Medications Prior to Admission medications   Medication Sig Start Date End Date Taking? Authorizing Provider  acetaminophen (TYLENOL) 500 MG tablet Take 1 tablet (500 mg total) by mouth every 6 (six) hours as needed. 07/19/19   Law, Waylan Boga, PA-C  aspirin EC 325 MG EC tablet Take 1 tablet (325 mg total) by mouth daily. 01/02/17   Rai, Delene Ruffini, MD  atorvastatin (LIPITOR) 40 MG tablet Take 1 tablet (40 mg total) by mouth at bedtime. 01/01/17   Rai, Delene Ruffini, MD  cephALEXin (KEFLEX) 500 MG capsule Take 1 capsule (500 mg total) by mouth 4 (four) times daily. 07/19/19   Law, Waylan Boga, PA-C  doxycycline (VIBRAMYCIN) 100 MG capsule Take 1 capsule (100 mg total) by mouth 2 (two) times daily. X 7 days 01/01/17   Cathren Harsh, MD  folic acid (FOLVITE) 1 MG tablet Take 1 tablet (1 mg total) by mouth daily. 01/02/17   Rai, Delene Ruffini, MD  HYDROcodone-acetaminophen (NORCO/VICODIN) 5-325 MG tablet Take 1-2 tablets by mouth every 4 (four) hours as needed. 01/12/18   Benjiman Core, MD  ibuprofen (  ADVIL) 600 MG tablet Take 1 tablet (600 mg total) by mouth every 6 (six) hours as needed. 07/19/19   Emi Holes, PA-C  Multiple Vitamin (MULTIVITAMIN WITH MINERALS) TABS tablet Take 1 tablet by mouth daily. 01/02/17   Cathren Harsh, MD    Allergies    No known allergies  Review of Systems   Review of Systems  Ten systems reviewed and are negative for acute change, except as noted in the HPI.    Physical Exam Updated Vital Signs BP (!) 150/81   Pulse 62   Temp 97.9 F (36.6 C) (Oral)   Resp 16   Ht 5\' 5"  (1.651 m)   Wt 68 kg   SpO2 100%   BMI 24.96 kg/m   Physical Exam Vitals and nursing note reviewed.  Constitutional:      General: He is not in acute distress.    Appearance: He is well-developed. He is not  diaphoretic.     Comments: Disheveled. Tan skin with dreadlocks. Nontoxic and in NAD.  HENT:     Head: Normocephalic and atraumatic.  Eyes:     General: No scleral icterus.    Conjunctiva/sclera: Conjunctivae normal.  Cardiovascular:     Rate and Rhythm: Normal rate and regular rhythm.     Pulses: Normal pulses.  Pulmonary:     Effort: Pulmonary effort is normal. No respiratory distress.     Comments: Respirations even and unlabored Abdominal:     General: There is no distension.     Palpations: Abdomen is soft.  Genitourinary:    Comments: Exam chaperoned by RN. Patient with right inguinal swelling which extends to the right hemiscrotum c/w known hernia. Unable to reduce 2/2 size. No erythema, ecchymosis. Penis normal. Musculoskeletal:        General: Normal range of motion.     Cervical back: Normal range of motion.  Skin:    General: Skin is warm and dry.     Coloration: Skin is not pale.     Findings: No erythema or rash.  Neurological:     Mental Status: He is alert and oriented to person, place, and time.     Coordination: Coordination normal.  Psychiatric:        Behavior: Behavior normal.     ED Results / Procedures / Treatments   Labs (all labs ordered are listed, but only abnormal results are displayed) Labs Reviewed  BASIC METABOLIC PANEL - Abnormal; Notable for the following components:      Result Value   Potassium 3.4 (*)    Calcium 8.4 (*)    All other components within normal limits  CBC - Abnormal; Notable for the following components:   WBC 3.8 (*)    RBC 3.58 (*)    Hemoglobin 11.4 (*)    HCT 35.7 (*)    All other components within normal limits    EKG None  Radiology No results found.  Procedures Procedures (including critical care time)  Medications Ordered in ED Medications - No data to display  ED Course  I have reviewed the triage vital signs and the nursing notes.  Pertinent labs & imaging results that were available during my  care of the patient were reviewed by me and considered in my medical decision making (see chart for details).    MDM Rules/Calculators/A&P                          64 year old male  presents to the emergency department via EMS after being found sleeping at a bus stop.  Initially reported to be hypotensive and was given IV fluids in route to the ED.  Patient was awaiting for approximately 10 hours in the waiting room before formal assessment.  Hemodynamically stable since ED arrival.  Screening labs completed which are largely consistent with baseline.  He has no acute complaints; only endorsing discomfort at site of chronic R inguinal hernia.  No signs of obstruction or strangulation.  Will refer to [facility] for outpatient follow up.  Return precautions provided and patient discharged in stable condition.   Final Clinical Impression(s) / ED Diagnoses Final diagnoses:  Inguinal hernia, right    Rx / DC Orders ED Discharge Orders    None       Antony Madura, PA-C 08/26/20 0458    Dione Booze, MD 08/26/20 [phone]

## 2021-07-22 ENCOUNTER — Other Ambulatory Visit: Payer: Self-pay

## 2021-07-22 ENCOUNTER — Ambulatory Visit: Payer: No Typology Code available for payment source | Admitting: Physician Assistant

## 2021-07-22 VITALS — BP 113/64 | HR 82 | Temp 98.7°F | Ht 68.0 in | Wt 135.0 lb

## 2021-07-22 DIAGNOSIS — Z125 Encounter for screening for malignant neoplasm of prostate: Secondary | ICD-10-CM

## 2021-07-22 DIAGNOSIS — K4090 Unilateral inguinal hernia, without obstruction or gangrene, not specified as recurrent: Secondary | ICD-10-CM | POA: Diagnosis not present

## 2021-07-22 DIAGNOSIS — Z6820 Body mass index (BMI) 20.0-20.9, adult: Secondary | ICD-10-CM

## 2021-07-22 DIAGNOSIS — R972 Elevated prostate specific antigen [PSA]: Secondary | ICD-10-CM | POA: Diagnosis not present

## 2021-07-22 DIAGNOSIS — E876 Hypokalemia: Secondary | ICD-10-CM

## 2021-07-22 DIAGNOSIS — Z13228 Encounter for screening for other metabolic disorders: Secondary | ICD-10-CM

## 2021-07-22 DIAGNOSIS — Z1159 Encounter for screening for other viral diseases: Secondary | ICD-10-CM

## 2021-07-22 DIAGNOSIS — D649 Anemia, unspecified: Secondary | ICD-10-CM | POA: Diagnosis not present

## 2021-07-22 DIAGNOSIS — Z114 Encounter for screening for human immunodeficiency virus [HIV]: Secondary | ICD-10-CM

## 2021-07-22 DIAGNOSIS — R7989 Other specified abnormal findings of blood chemistry: Secondary | ICD-10-CM

## 2021-07-22 NOTE — Progress Notes (Signed)
Patient has eaten today and patient has not taken medication today. Patient reports pain in the right side of groin which is constant and impedes in working for the past year and a half. Patient reports no assistance from the [place]. Patient is in between care due to [facility] insurance not being accepted from specialist. Patient reports frequent urination.

## 2021-07-22 NOTE — Progress Notes (Signed)
New Patient Office Visit  Subjective:  Patient ID: Ian Thornton, male    DOB: 06-19-1956  Age: 65 y.o. MRN: [id number]  CC:  Chief Complaint  Patient presents with   Hernia    HPI Ian Thornton states that he has been having right inguinal pain from a hernia since the beginning of 2019.  Reports that he has been seen at the [facility] for this condition, states that he was last seen in January 2021 and was referred to a surgeon for corrective measures.  Reports that he went to the surgery appointment and was told that they did not take his insurance.  Reports that he has not been able to return to the [place] since then.  Reports that he continues to have pain, states it is hard for him to work and has been sent home from work 3 times in the last couple of weeks.  States that he will use ibuprofen with some relief but unfortunately this does cause him GI distress.      Past Medical History:  Diagnosis Date   ETOH abuse     No past surgical history on file.  Family History  Family history unknown: Yes    Social History   Socioeconomic History   Marital status: Married    Spouse name: Not on file   Number of children: Not on file   Years of education: Not on file   Highest education level: Not on file  Occupational History   Not on file  Tobacco Use   Smoking status: Every Day    Packs/day: 0.50    Types: Cigarettes, Cigars   Smokeless tobacco: Never  Substance and Sexual Activity   Alcohol use: Yes    Alcohol/week: 16.0 standard drinks    Types: 16 Shots of liquor per week    Comment: 24 oz beer per day   Drug use: No    Comment: previously used cocaine   Sexual activity: Not Currently  Other Topics Concern   Not on file  Social History Narrative   Not on file   Social Determinants of Health   Financial Resource Strain: Not on file  Food Insecurity: Not on file  Transportation Needs: Not on file  Physical Activity: Not on file  Stress: Not on file  Social  Connections: Not on file  Intimate Partner Violence: Not on file    ROS Review of Systems  Constitutional:  Negative for chills and fever.  HENT: Negative.    Eyes: Negative.   Respiratory:  Negative for shortness of breath.   Cardiovascular:  Negative for chest pain.  Gastrointestinal:  Positive for abdominal pain. Negative for nausea and vomiting.  Endocrine: Negative.   Genitourinary:  Negative for difficulty urinating, genital sores, hematuria, penile pain, penile swelling, scrotal swelling and testicular pain.  Musculoskeletal: Negative.   Skin:  Negative for color change.  Allergic/Immunologic: Negative.   Neurological: Negative.   Hematological: Negative.   Psychiatric/Behavioral: Negative.     Objective:   Today's Vitals: BP 113/64 (BP Location: Left Arm, Patient Position: Sitting, Cuff Size: Normal)   Pulse 82   Temp 98.7 F (37.1 C) (Oral)   Ht 5\' 8"  (1.727 m)   Wt 135 lb (61.2 kg)   SpO2 100%   BMI 20.53 kg/m   Physical Exam Vitals and nursing note reviewed. Exam conducted with a chaperone present.  Constitutional:      Appearance: Normal appearance.  HENT:     Head: Normocephalic and atraumatic.  Right Ear: External ear normal.     Left Ear: External ear normal.     Nose: Nose normal.     Mouth/Throat:     Mouth: Mucous membranes are moist.     Pharynx: Oropharynx is clear.  Eyes:     Extraocular Movements: Extraocular movements intact.     Conjunctiva/sclera: Conjunctivae normal.     Pupils: Pupils are equal, round, and reactive to light.  Cardiovascular:     Rate and Rhythm: Normal rate and regular rhythm.     Pulses: Normal pulses.     Heart sounds: Normal heart sounds.  Pulmonary:     Effort: Pulmonary effort is normal.     Breath sounds: Normal breath sounds.  Genitourinary:    Comments: right inguinal swelling which extends to the right hemiscrotum c/w known hernia. Unable to reduce 2/2 size.  Musculoskeletal:        General: Normal  range of motion.     Cervical back: Normal range of motion and neck supple.  Skin:    General: Skin is warm and dry.  Neurological:     General: No focal deficit present.     Mental Status: He is alert and oriented to person, place, and time.  Psychiatric:        Mood and Affect: Mood normal.        Behavior: Behavior normal.        Thought Content: Thought content normal.        Judgment: Judgment normal.    Assessment & Plan:   Problem List Items Addressed This Visit       Other   Hypokalemia - Primary   Relevant Orders   Comp. Metabolic Panel (12) (Completed)   Other Visit Diagnoses     Hernia, inguinal, right       Relevant Orders   Ambulatory referral to General Surgery   US Pelvis Limited   Screening PSA (prostate specific antigen)       Relevant Orders   PSA (Completed)   Screening for HIV (human immunodeficiency virus)       Relevant Orders   HIV antibody (with reflex) (Completed)   Encounter for HCV screening test for low risk patient       Relevant Orders   HCV Ab w Reflex to Quant PCR (Completed)   Screening for metabolic disorder       Relevant Orders   CBC with Differential/Platelet (Completed)       Outpatient Encounter Medications as of 07/22/2021  Medication Sig   acetaminophen (TYLENOL) 500 MG tablet Take 1 tablet (500 mg total) by mouth every 6 (six) hours as needed.   aspirin EC 325 MG EC tablet Take 1 tablet (325 mg total) by mouth daily. (Patient not taking: Reported on 07/22/2021)   atorvastatin (LIPITOR) 40 MG tablet Take 1 tablet (40 mg total) by mouth at bedtime. (Patient not taking: Reported on 07/22/2021)   folic acid (FOLVITE) 1 MG tablet Take 1 tablet (1 mg total) by mouth daily. (Patient not taking: Reported on 07/22/2021)   ibuprofen (ADVIL) 600 MG tablet Take 1 tablet (600 mg total) by mouth every 6 (six) hours as needed. (Patient not taking: Reported on 07/22/2021)   Multiple Vitamin (MULTIVITAMIN WITH MINERALS) TABS tablet Take 1  tablet by mouth daily. (Patient not taking: Reported on 07/22/2021)   [DISCONTINUED] cephALEXin (KEFLEX) 500 MG capsule Take 1 capsule (500 mg total) by mouth 4 (four) times daily.   [DISCONTINUED] doxycycline (VIBRAMYCIN) 100 MG  capsule Take 1 capsule (100 mg total) by mouth 2 (two) times daily. X 7 days   [DISCONTINUED] HYDROcodone-acetaminophen (NORCO/VICODIN) 5-325 MG tablet Take 1-2 tablets by mouth every 4 (four) hours as needed.   No facility-administered encounter medications on file as of 07/22/2021.  1. Hernia, inguinal, right Patient encouraged to use Tylenol as needed to help with pain, red flags given for prompt reevaluation.  Patient education given on supportive care.  [facility] - Ambulatory referral to General Surgery - US Pelvis Limited; Future  2. Hypokalemia Patient did request wellness labs completed, on review of chart, patient does have history of hypokalemia. - Comp. Metabolic Panel (12)  3. Screening PSA (prostate specific antigen)  - PSA  4. Screening for HIV (human immunodeficiency virus)  - HIV antibody (with reflex)  5. Encounter for HCV screening test for low risk patient  - HCV Ab w Reflex to Quant PCR  6. Screening for metabolic disorder  - CBC with Differential/Platelet    I have reviewed the patient's medical history (PMH, PSH, Social History, Family History, Medications, and allergies) , and have been updated if relevant. I spent 32 minutes reviewing chart and  face to face time with patient.    Follow-up: Return if symptoms worsen or fail to improve.   Kasandra Knudsen Mayers, PA-C

## 2021-07-22 NOTE — Patient Instructions (Addendum)
I do encourage you to use Tylenol as needed to help you with your pain, [facility] to try and expedite your care.  I have started a referral for you to be seen by a surgeon for further evaluation of your hernia.   We will call you with today's lab results.  Roney Jaffe, PA-C Physician Assistant [facility] [email or web address]  Inguinal Hernia, Adult An inguinal hernia develops when fat or the intestines push through a weak spot in a muscle where the leg meets the lower abdomen (groin). This creates a bulge. This kind of hernia could also be: In the scrotum, if you are male. In folds of skin around the vagina, if you are male. There are three types of inguinal hernias: Hernias that can be pushed back into the abdomen (are reducible). This type rarely causes pain. Hernias that are not reducible (are incarcerated). Hernias that are not reducible and lose their blood supply (are strangulated). This type of hernia requires emergency surgery. What are the causes? This condition is caused by having a weak spot in the muscles or tissues in your groin. This develops over time. The hernia may poke through the weak spot when you suddenly strain your lower abdominal muscles, such as when you: Lift a heavy object. Strain to have a bowel movement. Constipation can lead to straining. Cough. What increases the risk? This condition is more likely to develop in: Males. Pregnant females. People who: Are overweight. Work in jobs that require long periods of standing or heavy lifting. Have had an inguinal hernia before. Smoke or have lung disease. These factors can lead to long-term (chronic) coughing. What are the signs or symptoms? Symptoms may depend on the size of the hernia. Often, a small inguinal hernia has no symptoms. Symptoms of a larger hernia may include: A bulge in the groin area. This is  easier to see when standing. It might not be visible when lying down. Pain or burning in the groin. This may get worse when lifting, straining, or coughing. A dull ache or a feeling of pressure in the groin. An unusual bulge in the scrotum, in males. Symptoms of a strangulated inguinal hernia may include: A bulge in your groin that is very painful and tender to the touch. A bulge that turns red or purple. Fever, nausea, and vomiting. Inability to have a bowel movement or to pass gas. How is this diagnosed? This condition is diagnosed based on your symptoms, your medical history, and a physical exam. Your health care provider may feel your groin area and ask youto cough. How is this treated? Treatment depends on the size of your hernia and whether you have symptoms. If you do not have symptoms, your health care provider may have you watch your hernia carefully and have you come in for follow-up visits. If your hernia islarge or if you have symptoms, you may need surgery to repair the hernia. Follow these instructions at home: Lifestyle Avoid lifting heavy objects. Avoid standing for long periods of time. Do not use any products that contain nicotine or tobacco. These products include cigarettes, chewing tobacco, and vaping devices, such as e-cigarettes. If you need help quitting, ask your health care provider. Maintain a healthy weight. Preventing constipation You may need to take these actions to prevent or treat constipation: Drink enough fluid to keep your urine pale yellow. Take over-the-counter or prescription medicines. Eat foods that are high in fiber,  such as beans, whole grains, and fresh fruits and vegetables. Limit foods that are high in fat and processed sugars, such as fried or sweet foods. General instructions You may try to push the hernia back in place by very gently pressing on it while lying down. Do not try to force the bulge back in if it will not push in easily. Watch  your hernia for any changes in shape, size, or color. Get help right away if you notice any changes. Take over-the-counter and prescription medicines only as told by your health care provider. Keep all follow-up visits. This is important. Contact a health care provider if: You have a fever or chills. You develop new symptoms. Your symptoms get worse. Get help right away if: You have pain in your groin that suddenly gets worse. You have a bulge in your groin that: Suddenly gets bigger and does not get smaller. Becomes red or purple or painful to the touch. You are a man and you have a sudden pain in your scrotum, or the size of your scrotum suddenly changes. You cannot push the hernia back in place by very gently pressing on it when you are lying down. You have nausea or vomiting that does not go away. You have a fast heartbeat. You cannot have a bowel movement or pass gas. These symptoms may represent a serious problem that is an emergency. Do not wait to see if the symptoms will go away. Get medical help right away. Call your local emergency services (911 in the [place] Summary An inguinal hernia develops when fat or the intestines push through a weak spot in a muscle where your leg meets your lower abdomen (groin). This condition is caused by having a weak spot in muscles or tissues in your groin. Symptoms may depend on the size of the hernia, and they may include pain or swelling in your groin. A small inguinal hernia often has no symptoms. Treatment may not be needed if you do not have symptoms. If you have symptoms or a large hernia, you may need surgery to repair the hernia. Avoid lifting heavy objects. Also, avoid standing for long periods of time. This information is not intended to replace advice given to you by your health care provider. Make sure you discuss any questions you have with your healthcare provider. Document Revised: 07/17/2020 Document Reviewed: 07/17/2020 Elsevier  Patient Education  2022 [employer].

## 2021-07-23 DIAGNOSIS — K4090 Unilateral inguinal hernia, without obstruction or gangrene, not specified as recurrent: Secondary | ICD-10-CM | POA: Insufficient documentation

## 2021-07-23 DIAGNOSIS — D649 Anemia, unspecified: Secondary | ICD-10-CM | POA: Insufficient documentation

## 2021-07-23 DIAGNOSIS — R972 Elevated prostate specific antigen [PSA]: Secondary | ICD-10-CM | POA: Insufficient documentation

## 2021-07-23 LAB — COMP. METABOLIC PANEL (12)
AST: 46 IU/L — ABNORMAL HIGH (ref 0–40)
Albumin/Globulin Ratio: 1.3 (ref 1.2–2.2)
Albumin: 3.5 g/dL — ABNORMAL LOW (ref 3.8–4.8)
Alkaline Phosphatase: 74 IU/L (ref 44–121)
BUN/Creatinine Ratio: 10 (ref 10–24)
BUN: 8 mg/dL (ref 8–27)
Bilirubin Total: <0.2 mg/dL (ref 0.0–1.2)
Calcium: 8.3 mg/dL — ABNORMAL LOW (ref 8.6–10.2)
Chloride: 106 mmol/L (ref 96–106)
Creatinine, Ser: 0.78 mg/dL (ref 0.76–1.27)
Globulin, Total: 2.8 g/dL (ref 1.5–4.5)
Glucose: 96 mg/dL (ref 65–99)
Potassium: 3.9 mmol/L (ref 3.5–5.2)
Sodium: 143 mmol/L (ref 134–144)
Total Protein: 6.3 g/dL (ref 6.0–8.5)
eGFR: 100 mL/min/{1.73_m2} (ref >59)

## 2021-07-23 LAB — CBC WITH DIFFERENTIAL/PLATELET
Basophils Absolute: 0 10*3/uL (ref 0.0–0.2)
Basos: 1 %
EOS (ABSOLUTE): 0.2 10*3/uL (ref 0.0–0.4)
Eos: 4 %
Hematocrit: 34.3 % — ABNORMAL LOW (ref 37.5–51.0)
Hemoglobin: 11.4 g/dL — ABNORMAL LOW (ref 13.0–17.7)
Immature Grans (Abs): 0 10*3/uL (ref 0.0–0.1)
Immature Granulocytes: 1 %
Lymphocytes Absolute: 1.3 10*3/uL (ref 0.7–3.1)
Lymphs: 35 %
MCH: 32.1 pg (ref 26.6–33.0)
MCHC: 33.2 g/dL (ref 31.5–35.7)
MCV: 97 fL (ref 79–97)
Monocytes Absolute: 0.5 10*3/uL (ref 0.1–0.9)
Monocytes: 14 %
Neutrophils Absolute: 1.8 10*3/uL (ref 1.4–7.0)
Neutrophils: 45 %
Platelets: 279 10*3/uL (ref 150–450)
RBC: 3.55 x10E6/uL — ABNORMAL LOW (ref 4.14–5.80)
RDW: 14.1 % (ref 11.6–15.4)
WBC: 3.8 10*3/uL (ref 3.4–10.8)

## 2021-07-23 LAB — PSA: Prostate Specific Ag, Serum: 31.1 ng/mL — ABNORMAL HIGH (ref 0.0–4.0)

## 2021-07-23 LAB — HCV AB W REFLEX TO QUANT PCR: HCV Ab: <0.1 s/co ratio (ref 0.0–0.9)

## 2021-07-23 LAB — HIV ANTIBODY (ROUTINE TESTING W REFLEX): HIV Screen 4th Generation wRfx: NONREACTIVE

## 2021-07-23 LAB — HCV INTERPRETATION

## 2021-07-23 NOTE — Addendum Note (Signed)
Addended by: Roney Jaffe on: 07/23/2021 09:14 AM   Modules accepted: Orders

## 2021-07-25 ENCOUNTER — Telehealth: Payer: Self-pay | Admitting: *Deleted

## 2021-07-25 NOTE — Telephone Encounter (Signed)
MA UTR patient on either listed contacts. MA was unsuccessful using emergency contacts phone number. Please advise patient to return to the [facility] to discuss his results and next steps.

## 2021-07-25 NOTE — Telephone Encounter (Signed)
-----   Message from Roney Jaffe, [place] sent at 07/23/2021  9:14 AM EDT ----- Please call patient and let him know that his screening for hepatitis C and HIV were negative.  His kidney function is within normal limits.  He does have 1 slightly elevated liver enzyme, this needs to be rechecked at next office visit.  His calcium is slightly below normal limits, I do recommend that he eat a high calcium diet as well as take a calcium supplement.  His potassium is within normal limits.  He does show signs of anemia, however this does appear to be chronic.  I do recommend follow-up testing be completed.  His screening for prostate cancer was abnormal, I do need to refer him to urology for further evaluation.  Please encourage him to return to the mobile unit for follow-up labs.

## 2021-09-12 ENCOUNTER — Emergency Department (HOSPITAL_COMMUNITY)
Admission: EM | Admit: 2021-09-12 | Discharge: 2021-09-12 | Disposition: A | Discharge status: Home / Self Care | Payer: No Typology Code available for payment source | Attending: Emergency Medicine | Admitting: Emergency Medicine

## 2021-09-12 ENCOUNTER — Other Ambulatory Visit: Payer: Self-pay

## 2021-09-12 ENCOUNTER — Encounter (HOSPITAL_COMMUNITY): Payer: Self-pay | Admitting: Pharmacy Technician

## 2021-09-12 ENCOUNTER — Other Ambulatory Visit (HOSPITAL_COMMUNITY): Payer: Self-pay

## 2021-09-12 DIAGNOSIS — K4090 Unilateral inguinal hernia, without obstruction or gangrene, not specified as recurrent: Secondary | ICD-10-CM | POA: Insufficient documentation

## 2021-09-12 DIAGNOSIS — N390 Urinary tract infection, site not specified: Secondary | ICD-10-CM | POA: Diagnosis not present

## 2021-09-12 DIAGNOSIS — F17210 Nicotine dependence, cigarettes, uncomplicated: Secondary | ICD-10-CM | POA: Diagnosis not present

## 2021-09-12 DIAGNOSIS — R109 Unspecified abdominal pain: Secondary | ICD-10-CM | POA: Diagnosis present

## 2021-09-12 LAB — COMPREHENSIVE METABOLIC PANEL
ALT: 13 U/L (ref 0–44)
AST: 23 U/L (ref 15–41)
Albumin: 3.2 g/dL — ABNORMAL LOW (ref 3.5–5.0)
Alkaline Phosphatase: 53 U/L (ref 38–126)
Anion gap: 8 (ref 5–15)
BUN: 12 mg/dL (ref 8–23)
CO2: 25 mmol/L (ref 22–32)
Calcium: 9 mg/dL (ref 8.9–10.3)
Chloride: 105 mmol/L (ref 98–111)
Creatinine, Ser: 0.76 mg/dL (ref 0.61–1.24)
GFR, Estimated: >60 mL/min (ref >60)
Glucose, Bld: 100 mg/dL — ABNORMAL HIGH (ref 70–99)
Potassium: 4.3 mmol/L (ref 3.5–5.1)
Sodium: 138 mmol/L (ref 135–145)
Total Bilirubin: 0.4 mg/dL (ref 0.3–1.2)
Total Protein: 6.8 g/dL (ref 6.5–8.1)

## 2021-09-12 LAB — URINALYSIS, ROUTINE W REFLEX MICROSCOPIC
Bilirubin Urine: NEGATIVE
Glucose, UA: NEGATIVE mg/dL
Ketones, ur: NEGATIVE mg/dL
Nitrite: NEGATIVE
Protein, ur: 100 mg/dL — AB
RBC / HPF: >50 RBC/hpf — ABNORMAL HIGH (ref 0–5)
Specific Gravity, Urine: 1.019 (ref 1.005–1.030)
WBC, UA: >50 WBC/hpf — ABNORMAL HIGH (ref 0–5)
pH: 5 (ref 5.0–8.0)

## 2021-09-12 LAB — CBC WITH DIFFERENTIAL/PLATELET
Abs Immature Granulocytes: 0 10*3/uL (ref 0.00–0.07)
Basophils Absolute: 0 10*3/uL (ref 0.0–0.1)
Basophils Relative: 0 %
Eosinophils Absolute: 0.2 10*3/uL (ref 0.0–0.5)
Eosinophils Relative: 5 %
HCT: 36.5 % — ABNORMAL LOW (ref 39.0–52.0)
Hemoglobin: 11.6 g/dL — ABNORMAL LOW (ref 13.0–17.0)
Lymphocytes Relative: 27 %
Lymphs Abs: 1 10*3/uL (ref 0.7–4.0)
MCH: 30.4 pg (ref 26.0–34.0)
MCHC: 31.8 g/dL (ref 30.0–36.0)
MCV: 95.5 fL (ref 80.0–100.0)
Monocytes Absolute: 0.5 10*3/uL (ref 0.1–1.0)
Monocytes Relative: 13 %
Neutro Abs: 2 10*3/uL (ref 1.7–7.7)
Neutrophils Relative %: 55 %
Platelets: 213 10*3/uL (ref 150–400)
RBC: 3.82 MIL/uL — ABNORMAL LOW (ref 4.22–5.81)
RDW: 14.1 % (ref 11.5–15.5)
WBC: 3.6 10*3/uL — ABNORMAL LOW (ref 4.0–10.5)
nRBC: 0 % (ref 0.0–0.2)
nRBC: 0 /100 WBC

## 2021-09-12 LAB — LIPASE, BLOOD: Lipase: 42 U/L (ref 11–51)

## 2021-09-12 MED ORDER — CEPHALEXIN 500 MG PO CAPS: ORAL_CAPSULE | ORAL | 0 refills | Status: DC

## 2021-09-12 MED ORDER — ACETAMINOPHEN 500 MG PO TABS
1000.0000 mg | ORAL_TABLET | Freq: Four times a day (QID) | ORAL | 0 refills | Status: AC | PRN
  Filled 2021-09-12: qty 30, 4d supply, fill #0

## 2021-09-12 NOTE — ED Provider Notes (Signed)
Emergency Medicine Provider Triage Evaluation Note  Ian Thornton , a 65 y.o. male  was evaluated in triage.  Pt complains of abd pain. Located to RLQ. Hx of hernia. States pain today slightly worse than normal. Worse with BM. Was previous seen by surgical center however no longer taking his insurance and was not seen. States today noted blood in his urine and has dysuria. No hx of similar. No fever, flank pain, pain with BM, emesis.  Review of Systems  Positive: RLQ pain, hematuria Negative: Fever, emesis, back pain   All other ROS neg  Physical Exam  BP (!) 146/76 (BP Location: Left Arm)   Pulse 79   Temp 98.7 F (37.1 C)   Resp 16   SpO2 100%  Gen:   Awake, no distress   Resp:  Normal effort  MSK:   Moves extremities without difficulty  ABD:  Hernia to RLQ, non reducible. No overlying erythema, warmth Other:    Medical Decision Making  Medically screening exam initiated at 9:46 AM.  Appropriate orders placed.  [name] was informed that the remainder of the evaluation will be completed by another provider, this initial triage assessment does not replace that evaluation, and the importance of remaining in the ED until their evaluation is complete.  Hernia, hematuria   Arnol Mcgibbon A, PA-C 09/12/21 [id number]    Tegeler, Canary Brim, MD 09/12/21 1110

## 2021-09-12 NOTE — ED Triage Notes (Signed)
Pt here with reports of R inguinal hernia. Pt reports constant pain X1 week and now urinating blood X2 days.

## 2021-09-12 NOTE — ED Notes (Signed)
Pt verbalizes understanding of discharge instructions. Opportunity for questions and answers were provided. Pt discharged from the ED.   ?

## 2021-09-12 NOTE — ED Provider Notes (Signed)
[facility] EMERGENCY DEPARTMENT Provider Note   CSN: [id number] Arrival date & time: 09/12/21  0920     History Chief Complaint  Patient presents with   Abdominal Pain    Ian Thornton is a 65 y.o. male.  The history is provided by the patient and medical records. No language interpreter was used.  Abdominal Pain  65 year old male significant history of alcohol abuse who presents complaining of abdominal pain.  Patient states he has an inguinal hernia has been there for the past several years in which she is try to follow-up with her surgeon but did not receive any treatment.  He endorsed pain at the site of his hernia which has been a chronic problem in.  Pain is achy throbbing moderate in severity.  He also noted some blood in his urine today.  He initially was evaluated at the [facility] and was given referral to general surgery but states that they refused to see him.  He denies any associated fever chills difficulty urinating nausea vomiting diarrhea or change in bowel movement.  Patient has been seen for this complaint several times in the past.  Patient have not attempt to reduce his hernia at baseline.  Past Medical History:  Diagnosis Date   ETOH abuse     Patient Active Problem List   Diagnosis Date Noted   Hernia, inguinal, right 07/23/2021   Hypocalcemia 07/23/2021   Anemia 07/23/2021   Elevated PSA, greater than or equal to 20 ng/ml 07/23/2021   Cellulitis 01/01/2017   CVA (cerebral vascular accident) (HCC) 12/28/2016   ETOH abuse 12/28/2016   DTs (delirium tremens) (HCC) 12/28/2016   Thrombocytopenia (HCC) 12/28/2016   Alcohol withdrawal seizure without complication (HCC)    Hyperlipidemia    Smoker    Acute ischemic left MCA stroke (HCC)    Hypokalemia    Hypomagnesemia    Dysarthria 12/27/2016    History reviewed. No pertinent surgical history.     Family History  Family history unknown: Yes    Social History   Tobacco Use    Smoking status: Every Day    Packs/day: 0.50    Types: Cigarettes, Cigars   Smokeless tobacco: Never  Substance Use Topics   Alcohol use: Yes    Alcohol/week: 16.0 standard drinks    Types: 16 Shots of liquor per week    Comment: 24 oz beer per day   Drug use: No    Comment: previously used cocaine    Home Medications Prior to Admission medications   Medication Sig Start Date End Date Taking? Authorizing Provider  acetaminophen (TYLENOL) 500 MG tablet Take 1 tablet (500 mg total) by mouth every 6 (six) hours as needed. 07/19/19   Law, Waylan Boga, PA-C  aspirin EC 325 MG EC tablet Take 1 tablet (325 mg total) by mouth daily. Patient not taking: Reported on 07/22/2021 01/02/17   Cathren Harsh, MD  atorvastatin (LIPITOR) 40 MG tablet Take 1 tablet (40 mg total) by mouth at bedtime. Patient not taking: Reported on 07/22/2021 01/01/17   Cathren Harsh, MD  folic acid (FOLVITE) 1 MG tablet Take 1 tablet (1 mg total) by mouth daily. Patient not taking: Reported on 07/22/2021 01/02/17   Cathren Harsh, MD  ibuprofen (ADVIL) 600 MG tablet Take 1 tablet (600 mg total) by mouth every 6 (six) hours as needed. Patient not taking: Reported on 07/22/2021 07/19/19   Emi Holes, PA-C  Multiple Vitamin (MULTIVITAMIN WITH MINERALS) TABS tablet  Take 1 tablet by mouth daily. Patient not taking: Reported on 07/22/2021 01/02/17   Cathren Harsh, MD    Allergies    No known allergies  Review of Systems   Review of Systems  Gastrointestinal:  Positive for abdominal pain.  All other systems reviewed and are negative.  Physical Exam Updated Vital Signs BP (!) 146/76 (BP Location: Left Arm)   Pulse 79   Temp 98.7 F (37.1 C)   Resp 16   SpO2 100%   Physical Exam Vitals and nursing note reviewed.  Constitutional:      General: He is not in acute distress.    Appearance: He is well-developed.  HENT:     Head: Atraumatic.  Eyes:     Conjunctiva/sclera: Conjunctivae normal.  Cardiovascular:      Rate and Rhythm: Normal rate and regular rhythm.  Pulmonary:     Effort: Pulmonary effort is normal.     Breath sounds: Normal breath sounds.  Abdominal:     Palpations: Abdomen is soft.     Hernia: A hernia is present. Hernia is present in the right inguinal area.  Genitourinary:    Testes:        Right: Swelling present.     Comments: Chaperone present during exam Musculoskeletal:     Cervical back: Neck supple.  Skin:    Findings: No rash.  Neurological:     Mental Status: He is alert.    ED Results / Procedures / Treatments   Labs (all labs ordered are listed, but only abnormal results are displayed) Labs Reviewed  CBC WITH DIFFERENTIAL/PLATELET - Abnormal; Notable for the following components:      Result Value   WBC 3.6 (*)    RBC 3.82 (*)    Hemoglobin 11.6 (*)    HCT 36.5 (*)    All other components within normal limits  COMPREHENSIVE METABOLIC PANEL - Abnormal; Notable for the following components:   Glucose, Bld 100 (*)    Albumin 3.2 (*)    All other components within normal limits  URINALYSIS, ROUTINE W REFLEX MICROSCOPIC - Abnormal; Notable for the following components:   Color, Urine AMBER (*)    APPearance CLOUDY (*)    Hgb urine dipstick LARGE (*)    Protein, ur 100 (*)    Leukocytes,Ua LARGE (*)    RBC / HPF >50 (*)    WBC, UA >50 (*)    Bacteria, UA RARE (*)    All other components within normal limits  LIPASE, BLOOD    EKG None  Radiology No results found.  Procedures Hernia reduction  Date/Time: 09/12/2021 11:50 AM Performed by: Fayrene Helper, PA-C Authorized by: Fayrene Helper, PA-C  Consent: Verbal consent obtained. Risks and benefits: risks, benefits and alternatives were discussed Consent given by: patient Patient understanding: patient states understanding of the procedure being performed Patient consent: the patient's understanding of the procedure matches consent given Patient identity confirmed: verbally with patient and arm  band Local anesthesia used: no  Anesthesia: Local anesthesia used: no  Sedation: Patient sedated: no  Patient tolerance: patient tolerated the procedure well with no immediate complications Comments: Right inguinal hernia reduced with steady pressure.  It was successfully reduced by me, patient tolerates well.     Medications Ordered in ED Medications - No data to display  ED Course  I have reviewed the triage vital signs and the nursing notes.  Pertinent labs & imaging results that were available during my care of the  patient were reviewed by me and considered in my medical decision making (see chart for details).    MDM Rules/Calculators/A&P                           BP 135/76   Pulse 64   Temp 98.7 F (37.1 C)   Resp 15   SpO2 100%   Final Clinical Impression(s) / ED Diagnoses Final diagnoses:  Right inguinal hernia  Lower urinary tract infectious disease    Rx / DC Orders ED Discharge Orders          Ordered    cephALEXin (KEFLEX) 500 MG capsule        09/12/21 1447    acetaminophen (TYLENOL) 500 MG tablet  Every 6 hours PRN        09/12/21 1447           11:27 AM Patient complaining of pain at his right inguinal hernia.  This is an ongoing issue for more than 2 years.  Patient does not normally reduce his hernia at baseline.  On exam he does have a large right inguinal hernia easily reducible by me.  Patient tolerates well.  He would benefit from outpatient follow-up with general surgery for more definitive treatment.  2:40 PM Labs are reassuring, urinalysis with findings concerning for urinary tract infection.  Given his urinary discomfort, and this finding we will treat for UTI with Keflex.  He does not have any CVA tenderness I have low suspicion for infected kidney stone.  He is overall well-appearing.  3:13 PM Patient finally got his medication readily available.  He is stable for discharge.  Outpatient follow-up with general surgery needs for  his inguinal hernia repair.  Return precaution given.  Patient voiced understanding and agrees with plan.   Fayrene Helper, PA-C 09/12/21 1515    Terald Sleeper, MD 09/12/21 [phone]

## 2021-09-12 NOTE — Discharge Instructions (Addendum)
You have a right inguinal hernia that is reducible.  Please call and follow-up closely with [facility] for outpatient management.  You would likely benefit from surgical repair.  You have also been diagnosed with having an urinary tract infection.  Take antibiotic as prescribed.  Return if you have any concern.  Take Tylenol as needed for pain.

## 2022-08-29 ENCOUNTER — Encounter (HOSPITAL_COMMUNITY): Payer: Self-pay | Admitting: *Deleted

## 2022-08-29 ENCOUNTER — Emergency Department (HOSPITAL_COMMUNITY)
Admission: EM | Admit: 2022-08-29 | Discharge: 2022-08-29 | Disposition: A | Discharge status: Home / Self Care | Payer: No Typology Code available for payment source | Attending: Emergency Medicine | Admitting: Emergency Medicine

## 2022-08-29 DIAGNOSIS — N3001 Acute cystitis with hematuria: Secondary | ICD-10-CM

## 2022-08-29 DIAGNOSIS — R1031 Right lower quadrant pain: Secondary | ICD-10-CM | POA: Diagnosis present

## 2022-08-29 DIAGNOSIS — K4090 Unilateral inguinal hernia, without obstruction or gangrene, not specified as recurrent: Secondary | ICD-10-CM | POA: Diagnosis not present

## 2022-08-29 LAB — CBC WITH DIFFERENTIAL/PLATELET
Abs Immature Granulocytes: 0.03 10*3/uL (ref 0.00–0.07)
Basophils Absolute: 0 10*3/uL (ref 0.0–0.1)
Basophils Relative: 0 %
Eosinophils Absolute: 0.1 10*3/uL (ref 0.0–0.5)
Eosinophils Relative: 1 %
HCT: 41.4 % (ref 39.0–52.0)
Hemoglobin: 13.8 g/dL (ref 13.0–17.0)
Immature Granulocytes: 0 %
Lymphocytes Relative: 15 %
Lymphs Abs: 1.2 10*3/uL (ref 0.7–4.0)
MCH: 28.8 pg (ref 26.0–34.0)
MCHC: 33.3 g/dL (ref 30.0–36.0)
MCV: 86.3 fL (ref 80.0–100.0)
Monocytes Absolute: 0.7 10*3/uL (ref 0.1–1.0)
Monocytes Relative: 10 %
Neutro Abs: 5.8 10*3/uL (ref 1.7–7.7)
Neutrophils Relative %: 74 %
Platelets: 289 10*3/uL (ref 150–400)
RBC: 4.8 MIL/uL (ref 4.22–5.81)
RDW: 15.8 % — ABNORMAL HIGH (ref 11.5–15.5)
WBC: 7.8 10*3/uL (ref 4.0–10.5)
nRBC: 0 % (ref 0.0–0.2)

## 2022-08-29 LAB — URINALYSIS, ROUTINE W REFLEX MICROSCOPIC
Bilirubin Urine: NEGATIVE
Glucose, UA: NEGATIVE mg/dL
Ketones, ur: NEGATIVE mg/dL
Nitrite: NEGATIVE
Protein, ur: NEGATIVE mg/dL
Specific Gravity, Urine: 1.003 — ABNORMAL LOW (ref 1.005–1.030)
WBC, UA: >50 WBC/hpf — ABNORMAL HIGH (ref 0–5)
pH: 6 (ref 5.0–8.0)

## 2022-08-29 LAB — COMPREHENSIVE METABOLIC PANEL
ALT: 25 U/L (ref 0–44)
AST: 23 U/L (ref 15–41)
Albumin: 3.9 g/dL (ref 3.5–5.0)
Alkaline Phosphatase: 52 U/L (ref 38–126)
Anion gap: 11 (ref 5–15)
BUN: 12 mg/dL (ref 8–23)
CO2: 24 mmol/L (ref 22–32)
Calcium: 9.2 mg/dL (ref 8.9–10.3)
Chloride: 100 mmol/L (ref 98–111)
Creatinine, Ser: 1.01 mg/dL (ref 0.61–1.24)
GFR, Estimated: >60 mL/min (ref >60)
Glucose, Bld: 75 mg/dL (ref 70–99)
Potassium: 3.7 mmol/L (ref 3.5–5.1)
Sodium: 135 mmol/L (ref 135–145)
Total Bilirubin: 0.9 mg/dL (ref 0.3–1.2)
Total Protein: 7.8 g/dL (ref 6.5–8.1)

## 2022-08-29 LAB — LACTIC ACID, PLASMA: Lactic Acid, Venous: 1.7 mmol/L (ref 0.5–1.9)

## 2022-08-29 MED ORDER — CIPROFLOXACIN HCL 500 MG PO TABS
500.0000 mg | ORAL_TABLET | Freq: Once | ORAL | Status: AC
  Administered 2022-08-29: 500 mg via ORAL
  Filled 2022-08-29: qty 1

## 2022-08-29 MED ORDER — CIPROFLOXACIN HCL 500 MG PO TABS: 500.0000 mg | ORAL_TABLET | Freq: Two times a day (BID) | ORAL | 0 refills | Status: AC

## 2022-08-29 NOTE — Discharge Instructions (Addendum)
Thank you for coming to [facility] Emergency Department. You were seen for urinary symptoms and an inguinal hernia. We did an exam, labs, and imaging, and these showed a urinary tract infection. We will treat you with ciprofloxacin twice per day for 10 days. For your hernia, gratefully there was no need for emergency surgery, and you can schedule an appointment to have it repaired as an outpatient. Please call [facility] at [phone] on Monday morning to schedule an appointment for your hernia repair.     Please follow up with your primary care provider within 1 week.   Do not hesitate to return to the ED or call 911 if you experience: -Worsening groin pain, swelling, redness, tenderness -Worsening urinary symptoms such as blood, frequency, pain -Lightheadedness, passing out -Fevers/chills -Anything else that concerns you

## 2022-08-29 NOTE — ED Provider Triage Note (Signed)
Emergency Medicine Provider Triage Evaluation Note  Ian Thornton , a 65 y.o. male  was evaluated in triage.  Pt complains of abdominal pain.  He has history of hernia.  [facility] and [facility] were [facility].  This has not been scheduled yet.  He states they put him on a 7-day course of medication but he does not know the name of.  He states he called them regarding the pain and was instructed to come into the emergency room if it was significant.  He is without fever, nausea, vomiting.  He does not wear his hematuria, and dysuria.  Review of Systems  Positive: As above Negative: As above  Physical Exam  BP (!) 149/75 (BP Location: Right Arm)   Pulse 85   Temp 98.8 F (37.1 C) (Oral)   Resp 14   Ht 5\' 8"  (1.727 m)   Wt 81.6 kg   SpO2 98%   BMI 27.37 kg/m  Gen:   Awake, no distress   Resp:  Normal effort  MSK:   Moves extremities without difficulty  Other:  Abdomen soft nontender.  GU exam deferred  Medical Decision Making  Medically screening exam initiated at 10:30 AM.  Appropriate orders placed.  Daveon Arpino was informed that the remainder of the evaluation will be completed by another provider, this initial triage assessment does not replace that evaluation, and the importance of remaining in the ED until their evaluation is complete.    Evlyn Courier, PA-C 08/29/22 1032

## 2022-08-29 NOTE — ED Provider Notes (Signed)
[facility] EMERGENCY DEPARTMENT Provider Note   CSN: [id number] Arrival date & time: 08/29/22  6440     History  Chief Complaint  Patient presents with   Abdominal Pain    Ian Thornton is a 66 y.o. male with history of left MCA stroke, EtOH abuse complicated by withdrawal/DTs, HLD, presents with abdominal pain.  Patient c/o R inguinal pain and swelling a/w dysuria/urinary frequency/hematuria.  He has history of right-sided inguinal hernia.  He states [facility] and [facility] were planning to [facility].  This has not been scheduled yet.  He states that on Wednesday the pain in his groin became so severe that he was doubled over in pain and actually fell in the street, did not lose consciousness or hit his head, was brought inside by a friend and laid down to rest after which his pain slightly improved.  Patient states, "I cannot take this anymore."  He states that the swelling in the groin has only gotten worse recently, that it does not ever go down or seem to reduce.  Denies any overlying skin changes to the hernia. Patient also states he was recently diagnosed with a UTI and was given an antibiotic to take twice per day for 7 days, cannot recall what it was called, and he completed that medication but is still having symptoms.  Symptoms include 2 weeks of urinary frequency, urinary incontinence, painful urination with hematuria.  Denies fever/chills, nausea vomiting diarrhea constipation, hematochezia/melena, penile discharge.  Has not had any recent unprotected sexual encounters, states he is not currently sexually active.  Denies any rectal pain, lower back pain, flank pain.    Abdominal Pain      Home Medications Prior to Admission medications   Medication Sig Start Date End Date Taking? Authorizing Provider  ciprofloxacin (CIPRO) 500 MG tablet Take 1 tablet (500 mg total) by mouth every 12 (twelve) hours for 10 days. 08/29/22 09/08/22 Yes Loetta Rough,  MD  acetaminophen (TYLENOL) 500 MG tablet Take 2 tablets (1,000 mg total) by mouth every 6 (six) hours as needed for moderate pain or mild pain. 09/12/21   Fayrene Helper, PA-C  aspirin EC 325 MG EC tablet Take 1 tablet (325 mg total) by mouth daily. Patient not taking: No sig reported 01/02/17   Rai, Ripudeep K, MD  atorvastatin (LIPITOR) 40 MG tablet Take 1 tablet (40 mg total) by mouth at bedtime. Patient not taking: No sig reported 01/01/17   Rai, Delene Ruffini, MD  cephALEXin (KEFLEX) 500 MG capsule 2 caps po bid x 7 days 09/12/21   Fayrene Helper, PA-C  folic acid (FOLVITE) 1 MG tablet Take 1 tablet (1 mg total) by mouth daily. Patient not taking: No sig reported 01/02/17   Rai, Delene Ruffini, MD  Multiple Vitamin (MULTIVITAMIN WITH MINERALS) TABS tablet Take 1 tablet by mouth daily. Patient not taking: No sig reported 01/02/17   Cathren Harsh, MD      Allergies    Patient has no known allergies.    Review of Systems   Review of Systems  Gastrointestinal:  Positive for abdominal pain.   Review of systems negative for fever/chills.  A 10 point review of systems was performed and is negative unless otherwise reported in HPI.  Physical Exam Updated Vital Signs BP (!) 144/69   Pulse 66   Temp 98 F (36.7 C) (Oral)   Resp 18   Ht 5\' 8"  (1.727 m)   Wt 81.6 kg  SpO2 97%   BMI 27.37 kg/m  Physical Exam General: Normal appearing male, lying in bed.  HEENT: PERRLA, Sclera anicteric, MMM, trachea midline. Cardiology: RRR, no murmurs/rubs/gallops. BL radial and DP pulses equal bilaterally.  Resp: Normal respiratory rate and effort. CTAB, no wheezes, rhonchi, crackles.  Abd: Soft, non-tender, non-distended. No rebound tenderness or guarding.  GU: Swelling of R inguinal region and R scrotum c/w known inguinal hernia. No overlying erythema. Diffuse mild TTP of the area. Normal appearing circumcised penis without any discharge or blood at urethral meatus. No crepitus, induration, or fluctuance. Unable  to palpate R testicle through swelling. Hernia not reducible on exam. MSK: No peripheral edema or signs of trauma. Extremities without deformity or TTP. No cyanosis or clubbing. Skin: warm, dry. No rashes or lesions. Back: No CVA tenderness Neuro: A&Ox4, CNs II-XII grossly intact. MAEs. Sensation grossly intact.  Psych: Normal mood and affect.   ED Results / Procedures / Treatments   Labs (all labs ordered are listed, but only abnormal results are displayed) Labs Reviewed  CBC WITH DIFFERENTIAL/PLATELET - Abnormal; Notable for the following components:      Result Value   RDW 15.8 (*)    All other components within normal limits  URINALYSIS, ROUTINE W REFLEX MICROSCOPIC - Abnormal; Notable for the following components:   APPearance CLOUDY (*)    Specific Gravity, Urine 1.003 (*)    Hgb urine dipstick MODERATE (*)    Leukocytes,Ua LARGE (*)    WBC, UA >50 (*)    Bacteria, UA RARE (*)    All other components within normal limits  URINE CULTURE  COMPREHENSIVE METABOLIC PANEL  LACTIC ACID, PLASMA  GC/CHLAMYDIA PROBE AMP (Cataract) NOT AT [facility]    EKG None  Radiology No results found.  Procedures Procedures    Medications Ordered in ED Medications  ciprofloxacin (CIPRO) tablet 500 mg (has no administration in time range)    ED Course/ Medical Decision Making/ A&P                          Medical Decision Making Amount and/or Complexity of Data Reviewed Labs: ordered.  Risk Prescription drug management.    Patient is overall very well appearing and in no acute distress. HDS, afebrile.  With patient's right inguinal pain, consider an incarcerated hernia, as he states that it never goes away, is intermittently painful.  Consider intermittent strangulation, though it does not appear to currently be strangulated with only minimal tenderness to palpation and no overlying erythema, patient in no acute distress.  Will check labs including CBC and lactate to evaluate for  bowel ischemia.  Patient with continued urinary symptoms without any flank or back/rectal pain.  Patient was able to find papers from his discharge and he was indeed treated with Macrobid twice daily x7 days.  Appears to not have adequately treated his UTI.  Considered also pyelonephritis versus prostatitis but with no clinical symptoms of either, likely just cystitis with hematuria.  We will retest a UA here today and get a urine culture.  Labs demonstrate normal CMP, lactate 1.7, no leukocytosis, hemoglobin 13.8.  Low concern for mesenteric ischemia or strangulated bowel given normal lactate. Patient states the pain has been severe in the groin and will consult surgery to evaluate.   UA demonstrates e/o UTI w/ moderate hemoglobin, large leukocytes, rare bacteria, greater than 50 white blood cells. Urine culture ordered. Culture today will inform on sensitivities. No reported unprotected sex  but will add GC given prior treatment failure. Will treat with ciprofloxacin 500 mg twice daily x10 days.   I have personally reviewed and interpreted all labs and imaging.   Clinical Course as of 08/29/22 2122  Fri Aug 29, 2022  2029 D/w surgery who state that with negative lactate, normal white count, acute strangulation not likely. Patient with chronically incarcerated hernia can f/u in clinic. Will give patient info for [facility] and can call Monday to make an appointment.  [HN]    Clinical Course User Index [HN] Loetta Rough, MD   Discussed with patient his results and conversation with surgery.  Patient is displeased with the fact that he will not get surgery immediately and I explained that not having to have emergent surgery is a very fortunate thing.  Patient is encouraged to call [facility] on Monday morning to make an appointment as soon as possible for an outpatient inguinal hernia repair.  Patient is given extensive return precautions related to the hernia.  Also informed him of his new  ciprofloxacin prescription and will give a dose here before he is discharged.  Patient is given return precautions related to the UTI as well.  All questions answered to patient's satisfaction, patient reports understanding.  Dispo: DC with discharge instructions and return precautions         Final Clinical Impression(s) / ED Diagnoses Final diagnoses:  Acute cystitis with hematuria  Unilateral inguinal hernia without obstruction or gangrene, recurrence not specified    Rx / DC Orders ED Discharge Orders          Ordered    ciprofloxacin (CIPRO) 500 MG tablet  Every 12 hours        08/29/22 2118             This note was created using dictation software, which may contain spelling or grammatical errors.    Loetta Rough, MD 08/29/22 2123

## 2022-08-29 NOTE — ED Triage Notes (Signed)
C/o right inguinal pain and swelling with hematuria , went to [place] last thrurs. And was told to  go to the ED.

## 2022-08-31 LAB — URINE CULTURE: Culture: NO GROWTH

## 2024-11-05 ENCOUNTER — Other Ambulatory Visit (HOSPITAL_COMMUNITY): Payer: Self-pay

## 2024-12-05 ENCOUNTER — Telehealth: Payer: Self-pay | Admitting: Radiation Oncology

## 2024-12-05 NOTE — Telephone Encounter (Signed)
 1/5 @ 9:53 am Left voicemail with [facility] for recently images to be pushed to powershare and also sent via stat fax request.  Waiting on images.

## 2024-12-05 NOTE — Telephone Encounter (Signed)
 1/5 @ 10:14 am Left voicemail for patient to call our office to be sch for consult.

## 2024-12-07 NOTE — Progress Notes (Signed)
 GU Location of Tumor / Histology: Prostate Ca  If Prostate Cancer, Gleason Score is (4 + 3) and PSA is (42.9 on 05/13/2024)  Ian Thornton presented as referral from Dr. Frederic DOROTHA Grain ([facility]) elevated PSA  Biopsies       11/22/2024 Dr. Frederic Grain PET/CT Skull Base to Mid Thigh CLINICAL DATA:  The patient is a 69 year-old male with prostate cancer.  Evaluate for initial staging.  Increasing PSA.      Past/Anticipated interventions by urology, if any:  Dr. Frederic DOROTHA Grain    Past/Anticipated interventions by medical oncology, if any: NA  Weight changes, if any:  No  IPSS:  23 SHIM:  19  Bowel/Bladder complaints, if any:  No  Nausea/Vomiting, if any: No  Pain issues, if any:  0/10  SAFETY ISSUES: Prior radiation? No Pacemaker/ICD? No Possible current pregnancy? Male Is the patient on methotrexate? No  Current Complaints / other details:  None  30 minutes spent total, including time for meaningful use questions, reviewing medication, as well as spent in face-to-face time in nurse evaluation with the patient.

## 2024-12-09 ENCOUNTER — Other Ambulatory Visit: Payer: Self-pay | Admitting: Radiation Oncology

## 2024-12-09 ENCOUNTER — Inpatient Hospital Stay
Admission: RE | Admit: 2024-12-09 | Discharge: 2024-12-09 | Disposition: A | Payer: Self-pay | Source: Ambulatory Visit | Attending: Radiation Oncology | Admitting: Radiation Oncology

## 2024-12-09 DIAGNOSIS — C61 Malignant neoplasm of prostate: Secondary | ICD-10-CM

## 2024-12-13 ENCOUNTER — Telehealth: Payer: Self-pay | Admitting: Radiation Oncology

## 2024-12-13 NOTE — Telephone Encounter (Signed)
 1/13 Called [facility] for recent PET/CT images to be uploaded to PACs in epic.  Waiting on images.

## 2024-12-15 ENCOUNTER — Encounter: Payer: Self-pay | Admitting: Radiation Oncology

## 2024-12-19 ENCOUNTER — Ambulatory Visit
Admission: RE | Admit: 2024-12-19 | Discharge: 2024-12-19 | Disposition: A | Discharge status: Home / Self Care | Source: Ambulatory Visit | Attending: Radiation Oncology | Admitting: Radiation Oncology

## 2024-12-19 ENCOUNTER — Encounter: Payer: Self-pay | Admitting: Radiation Oncology

## 2024-12-19 VITALS — BP 142/81 | HR 74 | Temp 97.0°F | Resp 20 | Ht 68.0 in | Wt 129.4 lb

## 2024-12-19 DIAGNOSIS — F32A Depression, unspecified: Secondary | ICD-10-CM | POA: Insufficient documentation

## 2024-12-19 DIAGNOSIS — C61 Malignant neoplasm of prostate: Secondary | ICD-10-CM

## 2024-12-19 DIAGNOSIS — R3912 Poor urinary stream: Secondary | ICD-10-CM | POA: Insufficient documentation

## 2024-12-19 DIAGNOSIS — K7030 Alcoholic cirrhosis of liver without ascites: Secondary | ICD-10-CM | POA: Insufficient documentation

## 2024-12-19 DIAGNOSIS — E785 Hyperlipidemia, unspecified: Secondary | ICD-10-CM | POA: Insufficient documentation

## 2024-12-19 DIAGNOSIS — Z7982 Long term (current) use of aspirin: Secondary | ICD-10-CM | POA: Diagnosis not present

## 2024-12-19 DIAGNOSIS — I10 Essential (primary) hypertension: Secondary | ICD-10-CM | POA: Insufficient documentation

## 2024-12-19 DIAGNOSIS — F1011 Alcohol abuse, in remission: Secondary | ICD-10-CM | POA: Insufficient documentation

## 2024-12-19 DIAGNOSIS — F17210 Nicotine dependence, cigarettes, uncomplicated: Secondary | ICD-10-CM | POA: Insufficient documentation

## 2024-12-19 DIAGNOSIS — R311 Benign essential microscopic hematuria: Secondary | ICD-10-CM | POA: Insufficient documentation

## 2024-12-19 DIAGNOSIS — I700 Atherosclerosis of aorta: Secondary | ICD-10-CM | POA: Diagnosis not present

## 2024-12-19 DIAGNOSIS — K4090 Unilateral inguinal hernia, without obstruction or gangrene, not specified as recurrent: Secondary | ICD-10-CM | POA: Insufficient documentation

## 2024-12-19 DIAGNOSIS — Z79899 Other long term (current) drug therapy: Secondary | ICD-10-CM | POA: Diagnosis not present

## 2024-12-19 DIAGNOSIS — H2513 Age-related nuclear cataract, bilateral: Secondary | ICD-10-CM | POA: Insufficient documentation

## 2024-12-19 DIAGNOSIS — F1490 Cocaine use, unspecified, uncomplicated: Secondary | ICD-10-CM | POA: Insufficient documentation

## 2024-12-19 DIAGNOSIS — R972 Elevated prostate specific antigen [PSA]: Secondary | ICD-10-CM | POA: Insufficient documentation

## 2024-12-19 DIAGNOSIS — Z8673 Personal history of transient ischemic attack (TIA), and cerebral infarction without residual deficits: Secondary | ICD-10-CM | POA: Insufficient documentation

## 2024-12-19 DIAGNOSIS — Z5902 Unsheltered homelessness: Secondary | ICD-10-CM | POA: Insufficient documentation

## 2024-12-19 DIAGNOSIS — F1010 Alcohol abuse, uncomplicated: Secondary | ICD-10-CM | POA: Insufficient documentation

## 2024-12-19 DIAGNOSIS — Z5900 Homelessness unspecified: Secondary | ICD-10-CM | POA: Insufficient documentation

## 2024-12-19 DIAGNOSIS — Z599 Problem related to housing and economic circumstances, unspecified: Secondary | ICD-10-CM | POA: Insufficient documentation

## 2024-12-19 DIAGNOSIS — G40909 Epilepsy, unspecified, not intractable, without status epilepticus: Secondary | ICD-10-CM | POA: Insufficient documentation

## 2024-12-19 DIAGNOSIS — I2510 Atherosclerotic heart disease of native coronary artery without angina pectoris: Secondary | ICD-10-CM | POA: Insufficient documentation

## 2024-12-19 DIAGNOSIS — E781 Pure hyperglyceridemia: Secondary | ICD-10-CM | POA: Insufficient documentation

## 2024-12-19 DIAGNOSIS — R3129 Other microscopic hematuria: Secondary | ICD-10-CM | POA: Insufficient documentation

## 2024-12-19 DIAGNOSIS — Z720 Tobacco use: Secondary | ICD-10-CM | POA: Insufficient documentation

## 2024-12-19 HISTORY — DX: Hyperlipidemia, unspecified: E78.5

## 2024-12-19 HISTORY — DX: Anemia, unspecified: D64.9

## 2024-12-19 HISTORY — DX: Cerebral infarction, unspecified: I63.9

## 2024-12-19 HISTORY — DX: Elevated prostate specific antigen (PSA): R97.20

## 2024-12-19 HISTORY — DX: Essential (primary) hypertension: I10

## 2024-12-19 HISTORY — DX: Malignant neoplasm of prostate: C61

## 2024-12-19 MED ORDER — TAMSULOSIN HCL 0.4 MG PO CAPS: 0.4000 mg | ORAL_CAPSULE | Freq: Every day | ORAL | 5 refills | Status: AC

## 2024-12-19 NOTE — Addendum Note (Signed)
 Encounter addended by: Patrcia Cough, MD on: 12/19/2024 10:36 AM  Actions taken: Problem List reviewed, Medication List reviewed, Allergies reviewed, Visit diagnoses modified, Problem List modified, Clinical Note Signed

## 2024-12-19 NOTE — Progress Notes (Addendum)
 " Radiation Oncology         [phone] ________________________________  Initial Outpatient Consultation  Name: Ian Thornton MRN: [id number]  Date: 12/19/2024  DOB: Oct 07, 1956  [facility]  Ian Thornton, Ian Pac, MD   REFERRING PHYSICIAN: Sindy Ian Pac, MD  DIAGNOSIS: 69 y.o. gentleman with Stage T1c adenocarcinoma of the prostate with Gleason score of 4+3, and PSA of 42.9.     ICD-10-CM   1. Malignant neoplasm of prostate (HCC)  C61       HISTORY OF PRESENT ILLNESS: Ian Thornton is a 69 year old soft-spoken gentleman referred for evaluation and management of newly diagnosed high-risk prostate adenocarcinoma in the setting of fragmented prior care and significant social barriers.  Review of records shows that he was seen at a mobile clinic associated with [facility] on 07/22/2021, where a screening PSA was noted to be 31 ng/mL. Documentation reflects attempts to refer him to urology at that time; however, these efforts were unsuccessful, and the patient reports that he was unaware of the abnormal PSA result and did not receive follow-up care.  More recently, repeat screening through the [facility] demonstrated a PSA of 42.9 ng/mL, prompting urologic evaluation. Prostate biopsy revealed adenocarcinoma with Gleason score 4+3=7, consistent with high-risk localized prostate cancer. Staging PSMA PET/CT (11/22/2024) demonstrated patchy PSMA uptake throughout the prostate gland with no evidence of pelvic nodal or distant metastatic disease   The patient endorses severe lower urinary tract obstructive symptoms, including weak stream, hesitancy, and incomplete emptying. He denies bone pain or constitutional symptoms. Given the degree of obstruction, tamsulosin  (Flomax ) was initiated today.  The patient was accompanied to todays visit by Ian Thornton, his [facility] social worker, who emphasized the importance of including him in all coordination of care. During the visit, it became apparent that  the patient has significant social challenges, including lack of reliable transportation and inconsistent phone access. The patient requested removal of his estranged wife as a contact. Mr. Thornton has been instrumental in helping the patient navigate care and will remain a key point of contact.  The patient does not currently have transportation for daily radiation therapy, and coordination with Ian Thornton (transportation services) will be required. Ian Thornton, our oncology navigator, will assist with [facility] coordination and treatment logistics.  The patient reviewed the biopsy results with his urologist and he has kindly been referred today for discussion of potential radiation treatment options.   PREVIOUS RADIATION THERAPY: No  PAST MEDICAL HISTORY:  Past Medical History:  Diagnosis Date   Anemia    CVA (cerebral vascular accident) (HCC)    Elevated PSA    ETOH abuse    Hyperlipidemia    Hypertension    Prostate cancer (HCC)       PAST SURGICAL HISTORY: Past Surgical History:  Procedure Laterality Date   PROSTATE BIOPSY      FAMILY HISTORY:  Family History  Family history unknown: Yes    SOCIAL HISTORY:  Social History   Socioeconomic History   Marital status: Married    Spouse name: Not on file   Number of children: Not on file   Years of education: Not on file   Highest education level: Not on file  Occupational History   Not on file  Tobacco Use   Smoking status: Every Day    Current packs/day: 0.50    Types: Cigarettes, Cigars   Smokeless tobacco: Never  Vaping Use   Vaping status: Never Used  Substance and Sexual Activity   Alcohol  use: Yes    Alcohol/week: 16.0 standard drinks of alcohol    Types: 16 Shots of liquor per week    Comment: 24 oz beer per day   Drug use: No    Comment: previously used cocaine   Sexual activity: Not Currently  Other Topics Concern   Not on file  Social History Narrative   Not on file   Social Drivers of Health    Tobacco Use: High Risk (12/19/2024)   Patient History    Smoking Tobacco Use: Every Day    Smokeless Tobacco Use: Never    Passive Exposure: Not on file  Financial Resource Strain: Not on file  Food Insecurity: Food Insecurity Present (12/19/2024)   Epic    Worried About Programme Researcher, Broadcasting/film/video in the Last Year: Never true    [employer] of Food in the Last Year: Sometimes true  Transportation Needs: No Transportation Needs (12/19/2024)   Epic    Lack of Transportation (Medical): No    Lack of Transportation (Non-Medical): No  Physical Activity: Not on file  Stress: Not on file  Social Connections: Not on file  Intimate Partner Violence: Not At Risk (12/19/2024)   Epic    Fear of Current or Ex-Partner: No    Emotionally Abused: No    Physically Abused: No    Sexually Abused: No  Depression (PHQ2-9): Low Risk (12/19/2024)   Depression (PHQ2-9)    PHQ-2 Score: 1  Alcohol Screen: Low Risk (12/19/2024)   Alcohol Screen    Last Alcohol Screening Score (AUDIT): 2  Housing: Low Risk (12/19/2024)   Epic    Unable to Pay for Housing in the Last Year: No    Number of Times Moved in the Last Year: 0    Homeless in the Last Year: No  Utilities: Not At Risk (12/19/2024)   Epic    Threatened with loss of utilities: No  Health Literacy: Not on file    ALLERGIES: Patient has no known allergies.  MEDICATIONS:  Current Outpatient Medications  Medication Sig Dispense Refill   amLODipine (NORVASC) 10 MG tablet Take 10 mg by mouth daily.     losartan (COZAAR) 100 MG tablet Take 100 mg by mouth daily.     acetaminophen  (TYLENOL ) 500 MG tablet Take 2 tablets (1,000 mg total) by mouth every 6 (six) hours as needed for moderate pain or mild pain. 30 tablet 0   aspirin  EC 325 MG EC tablet Take 1 tablet (325 mg total) by mouth daily. (Patient not taking: No sig reported) 30 tablet 5   atorvastatin  (LIPITOR) 40 MG tablet Take 1 tablet (40 mg total) by mouth at bedtime. (Patient not taking: No sig reported)  30 tablet 3   folic acid  (FOLVITE ) 1 MG tablet Take 1 tablet (1 mg total) by mouth daily. (Patient not taking: No sig reported) 30 tablet 3   Multiple Vitamin (MULTIVITAMIN WITH MINERALS) TABS tablet Take 1 tablet by mouth daily. (Patient not taking: No sig reported) 30 tablet 3   No current facility-administered medications for this encounter.      REVIEW OF SYSTEMS:  On review of systems, the patient reports that he is doing well overall. He denies any chest pain, shortness of breath, cough, fevers, chills, night sweats, unintended weight changes. He denies any bowel disturbances, and denies abdominal pain, nausea or vomiting. He denies any new musculoskeletal or joint aches or pains. His IPSS was Total Score: 23, indicating severe urinary symptoms (Reference 0-7 mild, 8-19  moderate, 20-35 severe).  His SHIM: 19, indicating he has mild erectile dysfunction (Reference - 22-25 None, 17-21 Mild, 8-16 Moderate, 1-7 Severe). A complete review of systems is obtained and is otherwise negative.     PHYSICAL EXAM:  Wt Readings from Last 3 Encounters:  12/19/24 129 lb 6.4 oz (58.7 kg)  08/29/22 180 lb (81.6 kg)  07/22/21 135 lb (61.2 kg)   Temp Readings from Last 3 Encounters:  12/19/24 (!) 97 F (36.1 C)  08/29/22 98 F (36.7 C) (Oral)  09/12/21 98.6 F (37 C) (Oral)   BP Readings from Last 3 Encounters:  12/19/24 (!) 142/81  08/29/22 (!) 136/90  09/12/21 (!) 142/74   Pulse Readings from Last 3 Encounters:  12/19/24 74  08/29/22 77  09/12/21 67    /10  In general this is a well appearing male in no acute distress. He's alert and oriented x4 and appropriate throughout the examination. Cardiopulmonary assessment is negative for acute distress, and he exhibits normal effort.     KPS = 100  100 - Normal; no complaints; no evidence of disease. 90   - Able to carry on normal activity; minor signs or symptoms of disease. 80   - Normal activity with effort; some signs or symptoms of  disease. 43   - Cares for self; unable to carry on normal activity or to do active work. 60   - Requires occasional assistance, but is able to care for most of his personal needs. 50   - Requires considerable assistance and frequent medical care. 40   - Disabled; requires special care and assistance. 30   - Severely disabled; hospital admission is indicated although death not imminent. 20   - Very sick; hospital admission necessary; active supportive treatment necessary. 10   - Moribund; fatal processes progressing rapidly. 0     - Dead  Karnofsky DA, Abelmann WH, Craver LS and Burchenal [facility] [phone]) The use of the nitrogen mustards in the palliative treatment of carcinoma: with particular reference to bronchogenic carcinoma Cancer 1 634-56  LABORATORY DATA:  Lab Results  Component Value Date   WBC 7.8 08/29/2022   HGB 13.8 08/29/2022   HCT 41.4 08/29/2022   MCV 86.3 08/29/2022   PLT 289 08/29/2022   Lab Results  Component Value Date   NA 135 08/29/2022   K 3.7 08/29/2022   CL 100 08/29/2022   CO2 24 08/29/2022   Lab Results  Component Value Date   ALT 25 08/29/2022   AST 23 08/29/2022   ALKPHOS 52 08/29/2022   BILITOT 0.9 08/29/2022     RADIOGRAPHY: No results found.    IMPRESSION/PLAN: 1. High-risk localized prostate adenocarcinoma PSA 42.9 Gleason 4+3=7 PSMA PET negative for nodal or distant disease Clinical picture consistent with high-risk, potentially curable disease 2. Severe lower urinary tract obstructive symptoms Likely related to locally advanced prostate cancer Not a candidate for brachytherapy or seed boost 3. Fragmented prior care with social vulnerability Delayed diagnosis following elevated PSA in 2022 Limited transportation and phone access Reliance on [facility] social work for care coordination 4. Incidental coronary and aortic atherosclerotic calcifications Noted on PSMA PET/CT Findings suggest underlying cardiovascular disease risk Patient without known  symptomatic coronary disease per today's visit   PLAN Prostate Cancer (High Risk): Recommend definitive external beam radiation therapy (IMRT) with long-term androgen deprivation therapy (LT-ADT). Plan for 40 fractions of IMRT to the prostate  pelvic lymph nodes per high-risk protocol. Brachytherapy boost is not recommended due to significant  baseline urinary obstruction. Coordinate with [facility] to initiate ADT as soon as possible, ideally prior to radiation. Plan to begin IMRT after approximately 2 months of ADT. Urinary Symptoms: Start tamsulosin  (Flomax ) today. Monitor urinary function closely during ADT and prior to radiation simulation. Care Coordination / Social Support: Work closely with Ian Thornton (oncology navigator) to coordinate [facility] referrals, ADT initiation, and radiation authorization. Include Ian Thornton ([facility] social worker) in all care coordination communications. Engage Interior And Spatial Designer (presenter, broadcasting) to arrange reliable transportation for daily radiation treatments. Update contact information per patient request. Cardiovascular Risk / Incidental Imaging Findings: PSMA PET/CT demonstrated incidental coronary artery and aortic calcifications. While this study is not optimized for formal cardiovascular risk assessment, these findings may reflect underlying atherosclerotic disease. Will defer to primary care for cardiovascular risk stratification and optimization, including consideration of medical therapy and/or further evaluation if clinically indicated. Follow-up: Return visit after ADT initiation to finalize simulation timing. Multidisciplinary coordination with [facility] urology and oncology teams.  We personally spent 60 minutes in this encounter including chart review, reviewing radiological studies, meeting face-to-face with the patient, entering orders and completing documentation.      Donnice Barge, MD  [facility]  Radiation Oncology Direct Dial:  [phone]  Fax: [phone] [email or web address]  Skype  [email or web address]   This document serves as a record of services personally performed by Donnice Barge, MD and Sabra Rusk, PA-C. It was created on their behalf by Damien Blanks, a trained medical scribe. The creation of this record is based on the scribe's personal observations and the provider's statements to them. This document has been checked and approved by the attending provider. "

## 2024-12-30 NOTE — Progress Notes (Addendum)
 RN left message with patient's case worker to introduce myself and follow up with ADT.  RN spoke with patient's case worker, Leo. Vila updated that pending ADT due to [facility] still working to obtain authorization.  Will continue to follow up.
